# Patient Record
Sex: Female | Born: 1965
Health system: Southern US, Community
[De-identification: ages and names within clinical notes are randomized; demographics above are authoritative.]

## PROBLEM LIST (undated history)

## (undated) DIAGNOSIS — R011 Cardiac murmur, unspecified: Secondary | ICD-10-CM

## (undated) DIAGNOSIS — R002 Palpitations: Secondary | ICD-10-CM

## (undated) DIAGNOSIS — I10 Essential (primary) hypertension: Secondary | ICD-10-CM

## (undated) DIAGNOSIS — R55 Syncope and collapse: Secondary | ICD-10-CM

## (undated) HISTORY — PX: OVARIAN CYST REMOVAL: SHX89

## (undated) HISTORY — DX: Syncope and collapse: R55

---

## 1999-03-23 ENCOUNTER — Other Ambulatory Visit: Admission: RE | Admit: 1999-03-23 | Discharge: 1999-03-23 | Payer: Self-pay | Admitting: *Deleted

## 2000-03-17 ENCOUNTER — Other Ambulatory Visit: Admission: RE | Admit: 2000-03-17 | Discharge: 2000-03-17 | Payer: Self-pay | Admitting: *Deleted

## 2001-03-21 ENCOUNTER — Other Ambulatory Visit: Admission: RE | Admit: 2001-03-21 | Discharge: 2001-03-21 | Payer: Self-pay | Admitting: *Deleted

## 2002-03-26 ENCOUNTER — Other Ambulatory Visit: Admission: RE | Admit: 2002-03-26 | Discharge: 2002-03-26 | Payer: Self-pay | Admitting: Obstetrics and Gynecology

## 2003-03-29 ENCOUNTER — Other Ambulatory Visit: Admission: RE | Admit: 2003-03-29 | Discharge: 2003-03-29 | Payer: Self-pay | Admitting: Obstetrics and Gynecology

## 2004-04-01 ENCOUNTER — Other Ambulatory Visit: Admission: RE | Admit: 2004-04-01 | Discharge: 2004-04-01 | Payer: Self-pay | Admitting: Obstetrics and Gynecology

## 2005-04-19 ENCOUNTER — Other Ambulatory Visit: Admission: RE | Admit: 2005-04-19 | Discharge: 2005-04-19 | Payer: Self-pay | Admitting: Obstetrics and Gynecology

## 2007-06-14 ENCOUNTER — Encounter: Admission: RE | Admit: 2007-06-14 | Discharge: 2007-06-14 | Payer: Self-pay | Admitting: Obstetrics and Gynecology

## 2008-06-19 ENCOUNTER — Encounter: Admission: RE | Admit: 2008-06-19 | Discharge: 2008-06-19 | Payer: Self-pay | Admitting: Obstetrics and Gynecology

## 2009-06-20 ENCOUNTER — Encounter: Admission: RE | Admit: 2009-06-20 | Discharge: 2009-06-20 | Payer: Self-pay | Admitting: Obstetrics and Gynecology

## 2009-12-10 ENCOUNTER — Ambulatory Visit: Payer: Self-pay | Admitting: Sports Medicine

## 2009-12-10 DIAGNOSIS — M25569 Pain in unspecified knee: Secondary | ICD-10-CM | POA: Insufficient documentation

## 2010-01-22 ENCOUNTER — Ambulatory Visit: Payer: Self-pay | Admitting: Sports Medicine

## 2010-06-11 ENCOUNTER — Other Ambulatory Visit: Payer: Self-pay | Admitting: Obstetrics and Gynecology

## 2010-06-11 DIAGNOSIS — Z1231 Encounter for screening mammogram for malignant neoplasm of breast: Secondary | ICD-10-CM

## 2010-06-11 DIAGNOSIS — Z1239 Encounter for other screening for malignant neoplasm of breast: Secondary | ICD-10-CM

## 2010-06-16 NOTE — Assessment & Plan Note (Signed)
Summary: RIGHT KNEE INJURY   Vital Signs:  Patient profile:   45 year old female Height:      65.9 inches Weight:      109.50 pounds BMI:     17.79 Pulse rate:   84 / minute BP sitting:   93 / 63  (right arm)  Vitals Entered By: Terese Door (December 10, 2009 3:31 PM) CC: NP-right knee injury.  Hurting since March after doing a road race Pain Assessment Patient in pain? yes     Location: knee Intensity: 3 or 4   CC:  NP-right knee injury.  Hurting since March after doing a road race.  History of Present Illness: 45 yo F avid runner presents with 4 months of R knee pain.  1st noticed at end of a 5 mile race when she sprinted toward the finish line.  Noticed medial knee pain that worsened more the next day.  Unsure of exact mechanism, but did not notice stepping in obvious hole or twisting her knee. Generally runs 4-5 miles several times per week, however since injury she has declined to 2-3 miles 2-3 times per week. No night time pain. Has not tried much medicine. No swelling. Pain is not gradually worsening, but just not improving.  Physical Exam  Msk:  Knee: Normal to inspection with no erythema or effusion or obvious bony abnormalities. medial joint line tenderness and palpable, tender plica on medial side. No patellar tenderness or condyle tenderness. ROM normal in flexion and extension and lower leg rotation. Ligaments with solid consistent endpoints including ACL, PCL, LCL, MCL. Negative Mcmurray's and provocative meniscal tests. Non painful patellar compression. Patellar and quadriceps tendons unremarkable. Hamstring and quadriceps strength is normal.     Knee Exam   Limited R knee [place] - Patellar and quad tendon intact.  Med meniscus with hypoechoic line in midline, no gross tear.  Medial plica visualized.   Impression & Recommendations:  Problem # 1:  KNEE PAIN, RIGHT (ICD-719.46)  Likely 2/2 bruised medial meniscus, question if irritated pilca also  contributing. - see pt instructions, reiterated relative rest and letting pain be her guide - quad exercises (SLR, wall squats, etc) - f/u 6 weeks  Orders: [place] LIMITED ([id number])  Patient Instructions: 1)  No running for 6 weeks. 2)  Ok for cycling and walking as long as it doesn't hurt. 3)  Do quad exercises 3 sets of 10-15 daily. 4)  F/u 6 weeks.

## 2010-06-16 NOTE — Assessment & Plan Note (Signed)
Summary: F/U,MC   Vital Signs:  Patient profile:   45 year old female BP sitting:   126 / 80  Vitals Entered By: Enid Baas MD (January 22, 2010 10:49 AM)  History of Present Illness: Follow of RT knee pain probable cartilage contusion noted some split on [place] in RT medial mensicus no swelling no giving way not running and still has some occ sharp pains on steps but not as much walks up to hour per day and some soreness after but no severe pain  Physical Exam  General:  Well-developed,well-nourished,in no acute distress; alert,appropriate and cooperative throughout examination Msk:  RT knee exam shows no effusion; stable ligaments; negative Mcmurray's and provocative meniscal tests; non painful patellar compression; patellar and quadriceps tendons unremarkable.  pain only on some step up and step down exercise balance not goos with this but not unstable good static hip, quad strength  running gait is normal mid to forefoot strike    Impression & Recommendations:  Problem # 1:  KNEE PAIN, RIGHT (ICD-719.46) still suspect this was small mensicus tear or constusion  improved will gradually restart runnign add step up and down exercise cone touches  progress slowly reck 6 wks unless resolved  Patient Instructions: 1)  Restart training gradually 2)  elliptical should be OK - test this 3)  once elliptical is OK for 3 runs test treeadmill 4)  If OK x 3 runs then you can start outside 5)  stair climber or bike at [facility] will be good quads 6)  home exercise are step and cone touches 7)  keep up some of the basic ones if needed 8)  when you start running start at about 50% of what you did before and add no more than 10% per week 9)  6 weeks should see reasonable improvement - if not check with me again 10)  Ice after runs

## 2010-06-25 ENCOUNTER — Ambulatory Visit
Admission: RE | Admit: 2010-06-25 | Discharge: 2010-06-25 | Disposition: A | Discharge status: Home / Self Care | Payer: 59 | Source: Ambulatory Visit | Attending: Obstetrics and Gynecology | Admitting: Obstetrics and Gynecology

## 2010-06-25 DIAGNOSIS — Z1231 Encounter for screening mammogram for malignant neoplasm of breast: Secondary | ICD-10-CM

## 2010-07-25 ENCOUNTER — Emergency Department (HOSPITAL_COMMUNITY): Payer: 59

## 2010-07-25 ENCOUNTER — Emergency Department (HOSPITAL_COMMUNITY)
Admission: EM | Admit: 2010-07-25 | Discharge: 2010-07-25 | Disposition: A | Discharge status: Home / Self Care | Payer: 59 | Attending: Emergency Medicine | Admitting: Emergency Medicine

## 2010-07-25 DIAGNOSIS — R55 Syncope and collapse: Secondary | ICD-10-CM | POA: Insufficient documentation

## 2010-07-25 DIAGNOSIS — R110 Nausea: Secondary | ICD-10-CM | POA: Insufficient documentation

## 2010-07-25 DIAGNOSIS — R42 Dizziness and giddiness: Secondary | ICD-10-CM | POA: Insufficient documentation

## 2010-07-25 LAB — POCT I-STAT, CHEM 8
Calcium, Ion: 1.26 mmol/L (ref 1.12–1.32)
Creatinine, Ser: 0.8 mg/dL (ref 0.4–1.2)
Glucose, Bld: 116 mg/dL — ABNORMAL HIGH (ref 70–99)
HCT: 39 % (ref 36.0–46.0)
Hemoglobin: 13.3 g/dL (ref 12.0–15.0)

## 2010-07-25 LAB — HCG, SERUM, QUALITATIVE: Preg, Serum: NEGATIVE

## 2011-03-23 ENCOUNTER — Other Ambulatory Visit: Payer: Self-pay | Admitting: Obstetrics and Gynecology

## 2011-03-23 DIAGNOSIS — Z1231 Encounter for screening mammogram for malignant neoplasm of breast: Secondary | ICD-10-CM

## 2011-06-28 ENCOUNTER — Ambulatory Visit
Admission: RE | Admit: 2011-06-28 | Discharge: 2011-06-28 | Disposition: A | Discharge status: Home / Self Care | Payer: 59 | Source: Ambulatory Visit | Attending: Obstetrics and Gynecology | Admitting: Obstetrics and Gynecology

## 2011-06-28 DIAGNOSIS — Z1231 Encounter for screening mammogram for malignant neoplasm of breast: Secondary | ICD-10-CM

## 2011-07-10 ENCOUNTER — Encounter (HOSPITAL_COMMUNITY): Payer: Self-pay

## 2011-07-10 ENCOUNTER — Emergency Department (HOSPITAL_COMMUNITY)
Admission: EM | Admit: 2011-07-10 | Discharge: 2011-07-10 | Disposition: A | Discharge status: Home / Self Care | Payer: 59 | Attending: Emergency Medicine | Admitting: Emergency Medicine

## 2011-07-10 DIAGNOSIS — J392 Other diseases of pharynx: Secondary | ICD-10-CM

## 2011-07-10 LAB — CBC
Hemoglobin: 12.7 g/dL (ref 12.0–15.0)
MCV: 90.6 fL (ref 78.0–100.0)
Platelets: 157 10*3/uL (ref 150–400)
RBC: 3.85 MIL/uL — ABNORMAL LOW (ref 3.87–5.11)
WBC: 6.4 10*3/uL (ref 4.0–10.5)

## 2011-07-10 MED ORDER — BUTAMBEN-TETRACAINE-BENZOCAINE 2-2-14 % EX AERO
INHALATION_SPRAY | CUTANEOUS | Status: AC
  Administered 2011-07-10: 1 via TOPICAL
  Filled 2011-07-10: qty 56

## 2011-07-10 MED ORDER — BUTAMBEN-TETRACAINE-BENZOCAINE 2-2-14 % EX AERO
1.0000 | INHALATION_SPRAY | Freq: Once | CUTANEOUS | Status: AC
  Administered 2011-07-10: 1 via TOPICAL

## 2011-07-10 NOTE — Discharge Instructions (Signed)
As discussed, please maintain a soft diet until you have seen our ear nose and throat colleagues.  Your posterior knee pop.  If you develop significant bleeding, any difficulty breathing, any new pain, please return to the emergency department immediately.

## 2011-07-10 NOTE — ED Provider Notes (Signed)
History     CSN: [id number]  Arrival date & time 07/10/11  1748   First MD Initiated Contact with Patient 07/10/11 1759      Chief Complaint  Patient presents with  . Lymphadenopathy    HPI Patient presents after the acute onset of fullness in her throat.  She notes that she was eating dinner, felt a scratching sensation about the posterior throat, and immediately developed fullness.  She notes mild dyspnea, significant anxiety from the event, no pain anywhere.  No attempts at relief, no clear exacerbating factors to History reviewed. No pertinent past medical history.  History reviewed. No pertinent past surgical history.  History reviewed. No pertinent family history.  History  Substance Use Topics  . Smoking status: Not on file  . Smokeless tobacco: Not on file  . Alcohol Use: Not on file    OB History    Grav Para Term Preterm Abortions TAB SAB Ect Mult Living                  Review of Systems  All other systems reviewed and are negative.    Allergies  Penicillins  Home Medications  No current outpatient prescriptions on file.  BP 115/79  Pulse 68  Resp 16  Ht 5\' 6"  (1.676 m)  Wt 110 lb (49.896 kg)  BMI 17.75 kg/m2  SpO2 100%  LMP 06/18/2011  Physical Exam  Nursing note and vitals reviewed. Constitutional: She is oriented to person, place, and time. She appears well-developed and well-nourished.  HENT:  Head: Normocephalic and atraumatic.  Mouth/Throat:    Eyes: Conjunctivae and EOM are normal. Pupils are equal, round, and reactive to light.  Cardiovascular: Normal rate and regular rhythm.   Pulmonary/Chest: Effort normal. No stridor.  Musculoskeletal: She exhibits no edema.  Neurological: She is alert and oriented to person, place, and time. No cranial nerve deficit. Coordination normal.  Skin: Skin is warm and dry.  Psychiatric: She has a normal mood and affect.    ED Course  Procedures (including critical care time)  Labs Reviewed    CBC - Abnormal; Notable for the following:    RBC 3.85 (*)    HCT 34.9 (*)    MCHC 36.4 (*)    All other components within normal limits   No results found.   No diagnosis found.  Pulse oximetry 100% room air normal  Immediately after the patient's arrival, I did direct visualization of the mass following provision of topical anesthesia with Hurricaine spray.  7:12 PM The lesion is unchanged. MDM  This previously well 46 year old female presents following the sudden development of fullness in her left posterior throat.  The patient's description of a mild abrasion is consistent with the physical exam finding of a posterior blisterlike lesion just anterior to the tonsillar arch.  There is no evidence of uvular edema nor any other oral pharyngeal findings.  The patient's vital signs are unremarkable, and she is not hypoxic.  The patient was observed in the emergency department for several hours.  The patient's case was discussed with the ENT physician on-call.  The patient's blood work does not demonstrate thrombopenia.  The patient was discharged in stable condition with return precautions, instructions to maintain a soft diet, and instructions to follow up with ENT this week.        Gerhard Munch, MD 07/10/11 [phone]

## 2011-07-10 NOTE — ED Notes (Signed)
Pt brought by husband with c/o swollen glands, pt was out eating dinner, and had a tuff crusty bread that scratched the back of her mouth  and created swelling to the back of her throat

## 2011-10-26 ENCOUNTER — Encounter: Payer: Self-pay | Admitting: Cardiovascular Disease

## 2011-11-24 ENCOUNTER — Encounter: Payer: 59 | Admitting: Cardiovascular Disease

## 2011-12-30 ENCOUNTER — Ambulatory Visit (INDEPENDENT_AMBULATORY_CARE_PROVIDER_SITE_OTHER): Payer: 59 | Admitting: Cardiovascular Disease

## 2011-12-30 ENCOUNTER — Encounter: Payer: Self-pay | Admitting: Cardiovascular Disease

## 2011-12-30 VITALS — BP 106/70 | HR 50 | Ht 66.0 in | Wt 111.8 lb

## 2011-12-30 DIAGNOSIS — I4949 Other premature depolarization: Secondary | ICD-10-CM

## 2011-12-30 DIAGNOSIS — I498 Other specified cardiac arrhythmias: Secondary | ICD-10-CM

## 2011-12-30 DIAGNOSIS — R011 Cardiac murmur, unspecified: Secondary | ICD-10-CM

## 2011-12-30 DIAGNOSIS — R001 Bradycardia, unspecified: Secondary | ICD-10-CM

## 2011-12-30 DIAGNOSIS — R002 Palpitations: Secondary | ICD-10-CM

## 2011-12-30 DIAGNOSIS — I493 Ventricular premature depolarization: Secondary | ICD-10-CM | POA: Insufficient documentation

## 2011-12-30 LAB — CBC WITH DIFFERENTIAL/PLATELET
Basophils Absolute: 0 10*3/uL (ref 0.0–0.1)
Eosinophils Absolute: 0 10*3/uL (ref 0.0–0.7)
Hemoglobin: 13 g/dL (ref 12.0–15.0)
MCHC: 33.4 g/dL (ref 30.0–36.0)
Monocytes Relative: 7.4 % (ref 3.0–12.0)
Neutro Abs: 3.1 10*3/uL (ref 1.4–7.7)
Platelets: 154 10*3/uL (ref 150.0–400.0)
RDW: 13.5 % (ref 11.5–14.6)

## 2011-12-30 LAB — MAGNESIUM: Magnesium: 2 mg/dL (ref 1.5–2.5)

## 2011-12-30 LAB — BASIC METABOLIC PANEL
BUN: 12 mg/dL (ref 6–23)
Creatinine, Ser: 0.7 mg/dL (ref 0.4–1.2)
GFR: 96.32 mL/min (ref >60.00)
Glucose, Bld: 84 mg/dL (ref 70–99)

## 2011-12-30 NOTE — Assessment & Plan Note (Signed)
Candice Hansen is doing fairly well. She is having some palpitations which clinically sound like premature ventricular contractions.  We discussed the fact that these are likely benign. I've offered to place a  monitor on her but at this point will wait. We'll check labs including a basic metabolic profile, magnesium level, TSH, and CBC.  I have asked her to call for any worsening palpitations.

## 2011-12-30 NOTE — Patient Instructions (Addendum)
Your physician recommends that you return for lab work in: today bmet cbc tsh magnesium  Your physician wants you to follow-up in: 1 year  You will receive a reminder letter in the mail two months in advance. If you don't receive a letter, please call our office to schedule the follow-up appointment.

## 2011-12-30 NOTE — Progress Notes (Signed)
    Candice Hansen Date of Birth  [birth year]       [facility] Office [address]  [address] [place], [place]  [address]   [place], [place]  [address] [phone]     [phone]   Fax  [phone]    Fax [phone]  Problem List: 1. History of syncope 2. Palpitations  History of Present Illness:  Candice Hansen is a 46 yo with hx of syncope in the past.  She is an avid runner.  She has been having some paliptations - usually in the morning of evenings.  The heart beat irregularities are brief (< 1 second).  These typically occur when she is at rest. They never occur when she is active. The associated with a very brief sensation of inability to catch her breath.  She's never had any prolonged episodes of dysrhythmia. She's been running without difficulties.  Current Outpatient Prescriptions on File Prior to Visit  Medication Sig Dispense Refill  . Calcium Carbonate Antacid (TUMS PO) Take by mouth as needed.       Marland Kitchen CALCIUM PO Take by mouth as needed.         Allergies  Allergen Reactions  . Penicillins Rash    Past Medical History  Diagnosis Date  . Syncope   . Chest pain     Past Surgical History  Procedure Date  . Ovarian cyst removal     History  Smoking status  . Never Smoker   Smokeless tobacco  . Not on file   she works as a Diplomatic Services operational officer at [employer].  History  Alcohol Use: Not on file    No family history on file.  Reviw of Systems:  Reviewed in the HPI.  All other systems are negative.  Physical Exam: Blood pressure 106/70, pulse 50, height 5\' 6"  (1.676 m), weight 111 lb 12.8 oz (50.712 kg). General: Well developed, well nourished, in no acute distress.  Head: Normocephalic, atraumatic, sclera non-icteric, mucus membranes are moist,   Neck: Supple. Carotids are 2 + without bruits. No JVD  Lungs: Clear bilaterally to auscultation.  Heart: regular rate.  normal  S1 S2.  She is very soft  systolic murmur. There is no midsystolic click.  Abdomen: Soft, non-tender, non-distended with normal bowel sounds. No hepatomegaly. No rebound/guarding. No masses.  Msk:  Strength and tone are normal.    Extremities: No clubbing or cyanosis. No edema.  Distal pedal pulses are 2+ and equal bilaterally.  Neuro: Alert and oriented X 3. Moves all extremities spontaneously.  Psych:  Responds to questions appropriately with a normal affect.  ECG: 12/30/2011-sinus bradycardia at 50 beats per minute. EKG is otherwise normal.  Assessment / Plan:

## 2012-02-17 ENCOUNTER — Encounter: Payer: Self-pay | Admitting: Cardiovascular Disease

## 2012-04-07 ENCOUNTER — Other Ambulatory Visit: Payer: Self-pay | Admitting: Obstetrics and Gynecology

## 2012-04-07 DIAGNOSIS — Z1231 Encounter for screening mammogram for malignant neoplasm of breast: Secondary | ICD-10-CM

## 2012-07-06 ENCOUNTER — Ambulatory Visit
Admission: RE | Admit: 2012-07-06 | Discharge: 2012-07-06 | Disposition: A | Discharge status: Home / Self Care | Payer: 59 | Source: Ambulatory Visit | Attending: Obstetrics and Gynecology | Admitting: Obstetrics and Gynecology

## 2012-07-06 DIAGNOSIS — Z1231 Encounter for screening mammogram for malignant neoplasm of breast: Secondary | ICD-10-CM

## 2012-12-25 ENCOUNTER — Ambulatory Visit (INDEPENDENT_AMBULATORY_CARE_PROVIDER_SITE_OTHER): Payer: 59 | Admitting: Cardiovascular Disease

## 2012-12-25 ENCOUNTER — Encounter: Payer: Self-pay | Admitting: Cardiovascular Disease

## 2012-12-25 VITALS — BP 100/68 | HR 56 | Ht 66.0 in | Wt 111.0 lb

## 2012-12-25 DIAGNOSIS — I493 Ventricular premature depolarization: Secondary | ICD-10-CM

## 2012-12-25 DIAGNOSIS — I4949 Other premature depolarization: Secondary | ICD-10-CM

## 2012-12-25 NOTE — Patient Instructions (Addendum)
Your physician wants you to follow-up in: 1 year  You will receive a reminder letter in the mail two months in advance. If you don't receive a letter, please call our office to schedule the follow-up appointment.  Your physician recommends that you continue on your current medications as directed. Please refer to the Current Medication list given to you today.  

## 2012-12-25 NOTE — Progress Notes (Signed)
    Candice Hansen Date of Birth  1965-07-29       [facility] Office [address]  [address] [place], [place]  [address]   [place], [place]  [address] [phone]     [phone]   Fax  [phone]    Fax [phone]  Problem List: 1. History of syncope 2. Palpitations  History of Present Illness:  Candice Hansen is a 47 yo with hx of syncope in the past.  Candice Hansen is an avid runner.  Candice Hansen has been having some paliptations - usually in the morning of evenings.  The heart beat irregularities are brief (< 1 second).  These typically occur when Candice Hansen is at rest. They never occur when Candice Hansen is active. The associated with a very brief sensation of inability to catch her breath.  Candice Hansen's never had any prolonged episodes of dysrhythmia. Candice Hansen's been running without difficulties.  December 25, 2012:  Candice Hansen has done well. No syncope.  Still running. 3-4 miles a day.    Current Outpatient Prescriptions on File Prior to Visit  Medication Sig Dispense Refill  . Calcium Carbonate Antacid (TUMS PO) Take by mouth as needed.       Marland Kitchen CALCIUM PO Take by mouth as needed.        No current facility-administered medications on file prior to visit.    Allergies  Allergen Reactions  . Penicillins Rash    Past Medical History  Diagnosis Date  . Syncope   . Chest pain     Past Surgical History  Procedure Laterality Date  . Ovarian cyst removal      History  Smoking status  . Never Smoker   Smokeless tobacco  . 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  History  Alcohol Use: Not on file    No family history on file.  Reviw of Systems:  Reviewed in the HPI.  All other systems are negative.  Physical Exam: Blood pressure 100/68, pulse 56, height 5\' 6"  (1.676 m), weight 111 lb (50.349 kg). General: Well developed, well nourished, in no acute distress.  Head: Normocephalic, atraumatic, sclera non-icteric, mucus membranes are  moist,   Neck: Supple. Carotids are 2 + without bruits. No JVD  Lungs: Clear bilaterally to auscultation.  Heart: regular rate.  normal  S1 S2.  Candice Hansen is very soft systolic murmur. There is no midsystolic click.  Abdomen: Soft, non-tender, non-distended with normal bowel sounds. No hepatomegaly. No rebound/guarding. No masses.  Msk:  Strength and tone are normal.    Extremities: No clubbing or cyanosis. No edema.  Distal pedal pulses are 2+ and equal bilaterally.  Neuro: Alert and oriented X 3. Moves all extremities spontaneously.  Psych:  Responds to questions appropriately with a normal affect.  ECG: 12/25/12:  Sinus brady at 56, otherwise normal ECG  Assessment / Plan:

## 2012-12-25 NOTE — Assessment & Plan Note (Signed)
Candice Hansen is doing well.  She has been up around without any difficulty. She is no longer having a further episodes of syncope or presyncope. We'll see her again in one year. We'll check a fasting lipids, and hepatic profile, and basic metabolic profile. We'll also check an EKG.

## 2012-12-26 ENCOUNTER — Encounter: Payer: Self-pay | Admitting: Cardiovascular Disease

## 2013-03-22 ENCOUNTER — Other Ambulatory Visit: Payer: Self-pay

## 2013-03-26 ENCOUNTER — Other Ambulatory Visit: Payer: Self-pay

## 2013-03-26 DIAGNOSIS — Z1231 Encounter for screening mammogram for malignant neoplasm of breast: Secondary | ICD-10-CM

## 2013-05-17 DIAGNOSIS — R55 Syncope and collapse: Secondary | ICD-10-CM

## 2013-05-17 HISTORY — DX: Syncope and collapse: R55

## 2013-07-10 ENCOUNTER — Ambulatory Visit: Payer: 59

## 2013-07-11 ENCOUNTER — Ambulatory Visit
Admission: RE | Admit: 2013-07-11 | Discharge: 2013-07-11 | Disposition: A | Discharge status: Home / Self Care | Payer: Self-pay | Source: Ambulatory Visit

## 2013-07-11 ENCOUNTER — Ambulatory Visit: Payer: 59

## 2013-07-11 ENCOUNTER — Other Ambulatory Visit: Payer: Self-pay

## 2013-07-11 DIAGNOSIS — Z1231 Encounter for screening mammogram for malignant neoplasm of breast: Secondary | ICD-10-CM

## 2013-07-15 ENCOUNTER — Telehealth: Payer: Self-pay | Admitting: Cardiology

## 2013-07-15 ENCOUNTER — Emergency Department (HOSPITAL_BASED_OUTPATIENT_CLINIC_OR_DEPARTMENT_OTHER)
Admission: EM | Admit: 2013-07-15 | Discharge: 2013-07-15 | Disposition: A | Discharge status: Home / Self Care | Payer: 59 | Attending: Emergency Medicine | Admitting: Emergency Medicine

## 2013-07-15 ENCOUNTER — Emergency Department (HOSPITAL_BASED_OUTPATIENT_CLINIC_OR_DEPARTMENT_OTHER): Payer: 59

## 2013-07-15 ENCOUNTER — Encounter (HOSPITAL_BASED_OUTPATIENT_CLINIC_OR_DEPARTMENT_OTHER): Payer: Self-pay | Admitting: Emergency Medicine

## 2013-07-15 DIAGNOSIS — R55 Syncope and collapse: Secondary | ICD-10-CM

## 2013-07-15 DIAGNOSIS — R002 Palpitations: Secondary | ICD-10-CM | POA: Insufficient documentation

## 2013-07-15 DIAGNOSIS — R51 Headache: Secondary | ICD-10-CM | POA: Insufficient documentation

## 2013-07-15 DIAGNOSIS — Z880 Allergy status to penicillin: Secondary | ICD-10-CM | POA: Insufficient documentation

## 2013-07-15 DIAGNOSIS — R110 Nausea: Secondary | ICD-10-CM | POA: Insufficient documentation

## 2013-07-15 DIAGNOSIS — R42 Dizziness and giddiness: Secondary | ICD-10-CM | POA: Insufficient documentation

## 2013-07-15 HISTORY — DX: Palpitations: R00.2

## 2013-07-15 LAB — COMPREHENSIVE METABOLIC PANEL
ALT: 11 U/L (ref 0–35)
AST: 14 U/L (ref 0–37)
Albumin: 4 g/dL (ref 3.5–5.2)
Alkaline Phosphatase: 58 U/L (ref 39–117)
BUN: 13 mg/dL (ref 6–23)
CALCIUM: 9.3 mg/dL (ref 8.4–10.5)
CHLORIDE: 103 meq/L (ref 96–112)
CO2: 24 mEq/L (ref 19–32)
CREATININE: 0.8 mg/dL (ref 0.50–1.10)
GFR calc Af Amer: >90 mL/min (ref >90)
GFR, EST NON AFRICAN AMERICAN: 87 mL/min — AB (ref >90)
Glucose, Bld: 96 mg/dL (ref 70–99)
Potassium: 4.2 mEq/L (ref 3.7–5.3)
Sodium: 138 mEq/L (ref 137–147)
Total Bilirubin: 0.5 mg/dL (ref 0.3–1.2)
Total Protein: 7.3 g/dL (ref 6.0–8.3)

## 2013-07-15 LAB — CBC
HEMATOCRIT: 36.5 % (ref 36.0–46.0)
Hemoglobin: 12.6 g/dL (ref 12.0–15.0)
MCH: 31.8 pg (ref 26.0–34.0)
MCHC: 34.5 g/dL (ref 30.0–36.0)
MCV: 92.2 fL (ref 78.0–100.0)
Platelets: 149 10*3/uL — ABNORMAL LOW (ref 150–400)
RBC: 3.96 MIL/uL (ref 3.87–5.11)
RDW: 13 % (ref 11.5–15.5)
WBC: 4.7 10*3/uL (ref 4.0–10.5)

## 2013-07-15 LAB — TROPONIN I

## 2013-07-15 NOTE — ED Notes (Addendum)
Patient states that last night she became dizzy and had to lay down. Attempted to get up again few hours later and continued to feel dizzy. Also states that she was having 8/10 right shoulder pain at that time, which has resolved at this time. Continues to have a mild headache. Also c/o mild nausea. Patient does have a cardiologist due to palpatations. Patient states that she runs 4 times a week appx 4-5 miles and has for several years.

## 2013-07-15 NOTE — ED Provider Notes (Signed)
CSN: [id number]     Arrival date & time 07/15/13  1056 History   First MD Initiated Contact with Patient 07/15/13 1107     Chief Complaint  Patient presents with  . Near Syncope      HPI Patient states that last night she became dizzy and had to lay down. Attempted to get up again few hours later and continued to feel dizzy. Also states that she was having 8/10 right shoulder pain at that time, which has resolved at this time. Continues to have a mild headache. Also c/o mild nausea. Patient does have a cardiologist due to palpatations. Patient states that she runs 4 times a week appx 4-5 miles and has for several years. Original note by Stephannie Li, RN at 07/15/2013 11:05  Past Medical History  Diagnosis Date  . Syncope   . Chest pain   . Palpitation    Past Surgical History  Procedure Laterality Date  . Ovarian cyst removal     History reviewed. No pertinent family history. History  Substance Use Topics  . Smoking status: Never Smoker   . Smokeless tobacco: Not on file  . Alcohol Use: No     Comment: social   OB History   Grav Para Term Preterm Abortions TAB SAB Ect Mult Living                 Review of Systems  Unable to perform ROS: Mental status change      Allergies  Penicillins  Home Medications   Current Outpatient Rx  Name  Route  Sig  Dispense  Refill  . Calcium Carbonate Antacid (TUMS PO)   Oral   Take by mouth as needed.          Marland Kitchen CALCIUM PO   Oral   Take by mouth as needed.          . Pseudoephedrine HCl (SUDAFED PO)   Oral   Take by mouth as needed.          BP 94/59  Pulse 58  Resp 13  Ht 5\' 5"  (1.651 m)  Wt 115 lb (52.164 kg)  BMI 19.14 kg/m2  SpO2 100%  LMP 06/26/2013 Physical Exam  Nursing note and vitals reviewed. Constitutional: She is oriented to person, place, and time. She appears well-developed and well-nourished. No distress.  HENT:  Head: Normocephalic and atraumatic.  Eyes: Pupils are equal, round, and  reactive to light.  Neck: Normal range of motion.  Cardiovascular: Normal rate and intact distal pulses.   Pulmonary/Chest: No respiratory distress.  Abdominal: Normal appearance. She exhibits no distension. There is no tenderness. There is no rebound.  Musculoskeletal: Normal range of motion.  Neurological: She is alert and oriented to person, place, and time. No cranial nerve deficit. She exhibits normal muscle tone. Coordination normal.  Skin: Skin is warm and dry. No rash noted.  Psychiatric: She has a normal mood and affect. Her behavior is normal.    ED Course  Procedures (including critical care time) Labs Review  Imaging Review Results for orders placed during the hospital encounter of 07/15/13  CBC      Result Value Ref Range   WBC 4.7  4.0 - 10.5 K/uL   RBC 3.96  3.87 - 5.11 MIL/uL   Hemoglobin 12.6  12.0 - 15.0 g/dL   HCT 36.5  36.0 - 46.0 %   MCV 92.2  78.0 - 100.0 fL   MCH 31.8  26.0 - 34.0 pg  MCHC 34.5  30.0 - 36.0 g/dL   RDW 13.0  11.5 - 15.5 %   Platelets 149 (*) 150 - 400 K/uL  COMPREHENSIVE METABOLIC PANEL      Result Value Ref Range   Sodium 138  137 - 147 mEq/L   Potassium 4.2  3.7 - 5.3 mEq/L   Chloride 103  96 - 112 mEq/L   CO2 24  19 - 32 mEq/L   Glucose, Bld 96  70 - 99 mg/dL   BUN 13  6 - 23 mg/dL   Creatinine, Ser 0.80  0.50 - 1.10 mg/dL   Calcium 9.3  8.4 - 10.5 mg/dL   Total Protein 7.3  6.0 - 8.3 g/dL   Albumin 4.0  3.5 - 5.2 g/dL   AST 14  0 - 37 U/L   ALT 11  0 - 35 U/L   Alkaline Phosphatase 58  39 - 117 U/L   Total Bilirubin 0.5  0.3 - 1.2 mg/dL   GFR calc non Af Amer 87 (*) >90 mL/min   GFR calc Af Amer >90  >90 mL/min  TROPONIN I      Result Value Ref Range   Troponin I <0.30  <0.30 ng/mL   Dg Chest 2 View  07/15/2013   CLINICAL DATA:  Dizziness  EXAM: CHEST  2 VIEW  COMPARISON:  None.  FINDINGS: Cardiomediastinal silhouette is unremarkable. Mild thoracic dextroscoliosis. No acute infiltrate or pleural effusion. No pulmonary  edema.  IMPRESSION: No active cardiopulmonary disease.   Electronically Signed   By: Lahoma Crocker M.D.   On: 07/15/2013 12:12         EKG Interpretation   Date/Time:  Sunday July 15 2013 11:05:14 EST Ventricular Rate:  45 PR Interval:  118 QRS Duration: 88 QT Interval:  412 QTC Calculation: 356 R Axis:   71 Text Interpretation:  Sinus bradycardia Otherwise normal ECG Confirmed by  Audie Pinto  MD, Josiah Wojtaszek ([id number]) on 07/15/2013 11:07:37 AM      MDM   Final diagnoses:  Vasovagal episode        Dot Lanes, MD 07/20/13 1529

## 2013-07-15 NOTE — Discharge Instructions (Signed)
Near-Syncope  Near-syncope (commonly known as near fainting) is sudden weakness, dizziness, or feeling like you might pass out. This can happen when getting up or while standing for a long time. It is caused by a sudden decrease in blood flow to the brain, which can occur for various reasons. Most of the reasons are not serious.   HOME CARE  Watch your condition for any changes.   Have someone stay with you until you feel stable.   If you feel like you are going to pass out:   Lie down right away.   Breathe deeply and steadily.   Move only when the feeling has gone away. Most of the time, this feeling lasts only a few minutes. You may feel tired for several hours.   Drink enough fluids to keep your pee (urine) clear or pale yellow.   If you are taking blood pressure or heart medicine, stand up slowly.   Follow up with your doctor as told.  GET HELP RIGHT AWAY IF:    You have a severe headache.   You have unusual pain in the chest, belly (abdomen), or back.   You have bleeding from the mouth or butt (rectum), or you have black or tarry poop (stool).   You feel your heart beat differently than normal, or you have a very fast pulse.   You pass out, or you twitch and shake when you pass out.   You pass out when sitting or lying down.   You feel confused.   You have trouble walking.   You are weak.   You have vision problems.  MAKE SURE YOU:    Understand these instructions.   Will watch your condition.   Will get help right away if you are not doing well or get worse.  Document Released: 10/20/2007 Document Revised: 01/03/2013 Document Reviewed: 10/06/2012  [employer] Patient Information 2014 [employer].

## 2013-07-15 NOTE — Telephone Encounter (Signed)
Patient called answering service with multiple complaints. She is a patient of Dr Acie Fredrickson who follows her for symptomatic PVCs. She also has a history of syncope.   Today she reports right shoulder pain, abdominal pain, headache and nausea that started last night while sitting at rest. It has been waxing and waning through the night and is still present this AM. She also feels "clammy" and "just not my normal self." She denies CP or SOB. She denies palpitations. She denies syncope. She is not sure if she is running a fever. I instructed her to proceed to the nearest [facility] / ED to be evaluated as I am quite limited / cannot perform thorough assessment over the phone. She expressed verbal understanding and agrees with plan.

## 2013-09-03 NOTE — Telephone Encounter (Signed)
Will you call Luisa and see if she is feeling better.  I was going through my mail box and came across this old note from a month ago.

## 2013-09-04 NOTE — Telephone Encounter (Signed)
Called patient to assess current status; Left message for patient to call office

## 2013-09-11 NOTE — Telephone Encounter (Signed)
Left message for patient to call the office

## 2013-12-31 ENCOUNTER — Ambulatory Visit (INDEPENDENT_AMBULATORY_CARE_PROVIDER_SITE_OTHER): Payer: 59 | Admitting: *Deleted

## 2013-12-31 ENCOUNTER — Other Ambulatory Visit: Payer: Self-pay | Admitting: *Deleted

## 2013-12-31 DIAGNOSIS — I493 Ventricular premature depolarization: Secondary | ICD-10-CM

## 2013-12-31 DIAGNOSIS — I4949 Other premature depolarization: Secondary | ICD-10-CM

## 2013-12-31 LAB — BASIC METABOLIC PANEL
BUN: 16 mg/dL (ref 6–23)
CO2: 25 mEq/L (ref 19–32)
Calcium: 8.9 mg/dL (ref 8.4–10.5)
Chloride: 108 mEq/L (ref 96–112)
Creatinine, Ser: 0.8 mg/dL (ref 0.4–1.2)
GFR: 84.26 mL/min (ref >60.00)
Glucose, Bld: 83 mg/dL (ref 70–99)
POTASSIUM: 3.8 meq/L (ref 3.5–5.1)
SODIUM: 140 meq/L (ref 135–145)

## 2013-12-31 LAB — LIPID PANEL
CHOLESTEROL: 178 mg/dL (ref 0–200)
HDL: 55.6 mg/dL (ref >39.00)
LDL Cholesterol: 114 mg/dL — ABNORMAL HIGH (ref 0–99)
NonHDL: 122.4
TRIGLYCERIDES: 40 mg/dL (ref 0.0–149.0)
Total CHOL/HDL Ratio: 3
VLDL: 8 mg/dL (ref 0.0–40.0)

## 2013-12-31 LAB — HEPATIC FUNCTION PANEL
ALBUMIN: 4 g/dL (ref 3.5–5.2)
ALK PHOS: 52 U/L (ref 39–117)
ALT: 16 U/L (ref 0–35)
AST: 17 U/L (ref 0–37)
Bilirubin, Direct: 0.1 mg/dL (ref 0.0–0.3)
TOTAL PROTEIN: 6.8 g/dL (ref 6.0–8.3)
Total Bilirubin: 0.8 mg/dL (ref 0.2–1.2)

## 2014-01-04 ENCOUNTER — Ambulatory Visit (INDEPENDENT_AMBULATORY_CARE_PROVIDER_SITE_OTHER): Payer: 59 | Admitting: Cardiovascular Disease

## 2014-01-04 ENCOUNTER — Encounter: Payer: Self-pay | Admitting: Cardiovascular Disease

## 2014-01-04 VITALS — BP 80/60 | HR 52 | Ht 66.0 in | Wt 111.0 lb

## 2014-01-04 DIAGNOSIS — I4949 Other premature depolarization: Secondary | ICD-10-CM

## 2014-01-04 DIAGNOSIS — I493 Ventricular premature depolarization: Secondary | ICD-10-CM

## 2014-01-04 NOTE — Patient Instructions (Signed)
Your physician recommends that you continue on your current medications as directed. Please refer to the Current Medication list given to you today.  Your physician recommends that you schedule a follow-up appointment in: as needed with Dr. Nahser  

## 2014-01-04 NOTE — Assessment & Plan Note (Signed)
Candice Hansen It is doing well. She's not having episodes of palpitations or syncope in quite some time. He still exercises on a regular basis.  We checked her cholesterol levels and her LDL is mildly elevated. She will followup with her doctor. I'll see her on an as-needed basis.

## 2014-01-04 NOTE — Progress Notes (Signed)
    Candice Hansen Date of Birth  [birth year]       [facility] [address], [place], [place]  [address]   [place], [place]  [address] [phone]     [phone]   Fax  [phone]    Fax [phone]  Problem List: 1. History of syncope 2. Palpitations  History of Present Illness:  Candice Hansen is a 48 yo with hx of syncope in the past.  She is an avid runner.  She has been having some paliptations - usually in the morning of evenings.  The heart beat irregularities are brief (< 1 second).  These typically occur when she is at rest. They never occur when she is active. The associated with a very brief sensation of inability to catch her breath.  She's never had any prolonged episodes of dysrhythmia. She's been running without difficulties.  December 25, 2012:  Candice Hansen has done well. No syncope.  Still running. 3-4 miles a day.    January 04, 2014:    Current Outpatient Prescriptions on File Prior to Visit  Medication Sig Dispense Refill  . Calcium Carbonate Antacid (TUMS PO) Take by mouth as needed.       Marland Kitchen CALCIUM PO Take by mouth as needed.       . Pseudoephedrine HCl (SUDAFED PO) Take by mouth as needed.       No current facility-administered medications on file prior to visit.    Allergies  Allergen Reactions  . Penicillins Rash    Past Medical History  Diagnosis Date  . Syncope   . Chest pain   . Palpitation     Past Surgical History  Procedure Laterality Date  . Ovarian cyst removal      History  Smoking status  . Never Smoker   Smokeless tobacco  . Not on file   she works as a Network engineer at [employer].  History  Alcohol Use No    Comment: social    No family history on file.  Reviw of Systems:  Reviewed in the HPI.  All other systems are negative.  Physical Exam: Blood pressure 80/60, pulse 52, height 5\' 6"  (1.676 m), weight 111 lb (50.349 kg). General: Well  developed, well nourished, in no acute distress.  Head: Normocephalic, atraumatic, sclera non-icteric, mucus membranes are moist,   Neck: Supple. Carotids are 2 + without bruits. No JVD  Lungs: Clear bilaterally to auscultation.  Heart: regular rate.  normal  S1 S2.  She is very soft systolic murmur. There is no midsystolic click.  Abdomen: Soft, non-tender, non-distended with normal bowel sounds. No hepatomegaly. No rebound/guarding. No masses.  Msk:  Strength and tone are normal.    Extremities: No clubbing or cyanosis. No edema.  Distal pedal pulses are 2+ and equal bilaterally.  Neuro: Alert and oriented X 3. Moves all extremities spontaneously.  Psych:  Responds to questions appropriately with a normal affect.  ECG:   Assessment / Plan:

## 2014-04-17 ENCOUNTER — Other Ambulatory Visit: Payer: Self-pay

## 2014-04-17 DIAGNOSIS — Z1231 Encounter for screening mammogram for malignant neoplasm of breast: Secondary | ICD-10-CM

## 2014-07-12 ENCOUNTER — Ambulatory Visit
Admission: RE | Admit: 2014-07-12 | Discharge: 2014-07-12 | Disposition: A | Discharge status: Home / Self Care | Payer: 59 | Source: Ambulatory Visit

## 2014-07-12 ENCOUNTER — Ambulatory Visit: Payer: 59

## 2014-07-12 DIAGNOSIS — Z1231 Encounter for screening mammogram for malignant neoplasm of breast: Secondary | ICD-10-CM

## 2015-04-02 ENCOUNTER — Other Ambulatory Visit: Payer: Self-pay

## 2015-04-02 DIAGNOSIS — Z1231 Encounter for screening mammogram for malignant neoplasm of breast: Secondary | ICD-10-CM

## 2015-07-17 ENCOUNTER — Ambulatory Visit
Admission: RE | Admit: 2015-07-17 | Discharge: 2015-07-17 | Disposition: A | Discharge status: Home / Self Care | Payer: 59 | Source: Ambulatory Visit

## 2015-07-17 DIAGNOSIS — Z1231 Encounter for screening mammogram for malignant neoplasm of breast: Secondary | ICD-10-CM

## 2016-04-20 ENCOUNTER — Other Ambulatory Visit: Payer: Self-pay | Admitting: Obstetrics and Gynecology

## 2016-04-20 DIAGNOSIS — Z1231 Encounter for screening mammogram for malignant neoplasm of breast: Secondary | ICD-10-CM

## 2016-07-20 ENCOUNTER — Ambulatory Visit: Payer: 59

## 2016-07-27 ENCOUNTER — Ambulatory Visit: Payer: 59

## 2016-08-10 DIAGNOSIS — Z01419 Encounter for gynecological examination (general) (routine) without abnormal findings: Secondary | ICD-10-CM | POA: Diagnosis not present

## 2016-08-12 ENCOUNTER — Ambulatory Visit
Admission: RE | Admit: 2016-08-12 | Discharge: 2016-08-12 | Disposition: A | Discharge status: Home / Self Care | Payer: 59 | Source: Ambulatory Visit | Attending: Obstetrics and Gynecology | Admitting: Obstetrics and Gynecology

## 2016-08-12 DIAGNOSIS — Z1231 Encounter for screening mammogram for malignant neoplasm of breast: Secondary | ICD-10-CM | POA: Diagnosis not present

## 2017-03-04 DIAGNOSIS — Z23 Encounter for immunization: Secondary | ICD-10-CM | POA: Diagnosis not present

## 2017-03-29 DIAGNOSIS — H5212 Myopia, left eye: Secondary | ICD-10-CM | POA: Diagnosis not present

## 2017-05-17 DIAGNOSIS — R55 Syncope and collapse: Secondary | ICD-10-CM

## 2017-05-17 HISTORY — DX: Syncope and collapse: R55

## 2017-05-17 HISTORY — PX: COLON RESECTION: SHX5231

## 2017-06-01 ENCOUNTER — Other Ambulatory Visit: Payer: Self-pay | Admitting: Obstetrics and Gynecology

## 2017-06-01 DIAGNOSIS — Z1231 Encounter for screening mammogram for malignant neoplasm of breast: Secondary | ICD-10-CM

## 2017-08-07 DIAGNOSIS — K562 Volvulus: Secondary | ICD-10-CM | POA: Diagnosis not present

## 2017-08-07 DIAGNOSIS — K567 Ileus, unspecified: Secondary | ICD-10-CM | POA: Diagnosis not present

## 2017-08-07 DIAGNOSIS — G8918 Other acute postprocedural pain: Secondary | ICD-10-CM | POA: Diagnosis not present

## 2017-08-07 DIAGNOSIS — R933 Abnormal findings on diagnostic imaging of other parts of digestive tract: Secondary | ICD-10-CM | POA: Diagnosis not present

## 2017-08-07 DIAGNOSIS — K388 Other specified diseases of appendix: Secondary | ICD-10-CM | POA: Diagnosis not present

## 2017-08-07 DIAGNOSIS — R918 Other nonspecific abnormal finding of lung field: Secondary | ICD-10-CM | POA: Diagnosis not present

## 2017-08-07 DIAGNOSIS — R109 Unspecified abdominal pain: Secondary | ICD-10-CM | POA: Diagnosis not present

## 2017-08-07 DIAGNOSIS — N281 Cyst of kidney, acquired: Secondary | ICD-10-CM | POA: Diagnosis not present

## 2017-08-15 ENCOUNTER — Ambulatory Visit: Payer: 59

## 2017-09-12 ENCOUNTER — Ambulatory Visit
Admission: RE | Admit: 2017-09-12 | Discharge: 2017-09-12 | Disposition: A | Discharge status: Home / Self Care | Payer: 59 | Source: Ambulatory Visit | Attending: Obstetrics and Gynecology | Admitting: Obstetrics and Gynecology

## 2017-09-12 DIAGNOSIS — Z1231 Encounter for screening mammogram for malignant neoplasm of breast: Secondary | ICD-10-CM

## 2017-09-14 DIAGNOSIS — Z681 Body mass index (BMI) 19 or less, adult: Secondary | ICD-10-CM | POA: Diagnosis not present

## 2017-09-14 DIAGNOSIS — Z01419 Encounter for gynecological examination (general) (routine) without abnormal findings: Secondary | ICD-10-CM | POA: Diagnosis not present

## 2017-10-11 ENCOUNTER — Ambulatory Visit (INDEPENDENT_AMBULATORY_CARE_PROVIDER_SITE_OTHER): Payer: 59 | Admitting: Sports Medicine

## 2017-10-11 ENCOUNTER — Encounter: Payer: Self-pay | Admitting: Sports Medicine

## 2017-10-11 DIAGNOSIS — K562 Volvulus: Secondary | ICD-10-CM | POA: Diagnosis not present

## 2017-10-11 NOTE — Patient Instructions (Signed)
It was a pleasure seeing you today in our clinic. Today we discussed ways to strengthen abdomin and hips. Here are the exercises we discussed:   1. Isometric abdominal exercises  2. 15-20 full sit ups 3. Start with 3x15 abdominal crunches, gradually increase repetitions.  4. Hip exercises are on the separate sheet  Gradually increase running in intervals with walking.

## 2017-10-11 NOTE — Progress Notes (Signed)
   HPI  CC: Return to exercise after surgery  Patient was previously an avid runner prior to a right hemicolectomy for cecal volvulus 9 weeks ago. She previously ran 3-4 miles 5 times per week. Since her surgery, she has lifted no more than 20-25 lbs and has been walking 3-4 miles per day. She has not had any difficulty with this activity level. She does endorse occasional mild pains in her abdomen that last only seconds and resolve on their own. Otherwise she has tolerated moderate activity. She attempted one set of sit-ups last week without difficulty.  Smoking Hx, Family Hx, Cc reviewed.  ROS: Per HPI; in addition no fever, no rash, no additional weakness, no additional numbness, no additional paresthesias, and no additional falls/injury.   Objective: BP 98/70   Ht 5' 5.5" (1.664 m)   Wt 110 lb (49.9 kg)   BMI 18.03 kg/m  Gen: NAD, well groomed, a/o x3, normal affect.  CV: Well-perfused. Warm.  Resp: Non-labored.  Abdomen:  Well-healing midline surgical scar. No hernia. Abdomen is soft and nontender without palpable masses.  No abdominal wall pain/tenderness with flexion of the leg or abdomen. Patient tolerates side plank, sit up and crunches bilaterally without pain. Hip, bilateral: Strength 5/5 in internal rotation, external rotation, extension, abduction and adduction bilaterally. Left abduction stronger than right.  Assessment and Plan:  Restarting Exercise s/p hemicolectomy:  Patient has had an uncomplicated post-surgical course over past 9 weeks and is currently tolerating walking daily. No red flags for hernia on history or exam. - discussed core exercises including isomeric abdominal exercises, sit ups, crunches, side planks and straight leg raises  - for abdominal exercises, recommend using a hand to support the abdomen - hip exercises also provided - gradual return to running, start with alternating jogging/walking  - follow up as needed  Everrett Coombe, MD PGY-2 [facility]  I observed and examined the patient with the resident and agree with assessment and plan.  Note reviewed and modified by me. Stefanie Libel, MD

## 2017-10-11 NOTE — Assessment & Plan Note (Signed)
Physical asessment to restart running  She is doing well and should be low risk to RT running  Gradually work on strength and more resistance with lifting

## 2017-10-27 DIAGNOSIS — Z0000 Encounter for general adult medical examination without abnormal findings: Secondary | ICD-10-CM | POA: Diagnosis not present

## 2017-10-27 DIAGNOSIS — Z9049 Acquired absence of other specified parts of digestive tract: Secondary | ICD-10-CM | POA: Diagnosis not present

## 2017-10-27 DIAGNOSIS — K562 Volvulus: Secondary | ICD-10-CM | POA: Diagnosis not present

## 2017-10-28 DIAGNOSIS — Z0000 Encounter for general adult medical examination without abnormal findings: Secondary | ICD-10-CM | POA: Diagnosis not present

## 2017-11-24 DIAGNOSIS — L570 Actinic keratosis: Secondary | ICD-10-CM | POA: Diagnosis not present

## 2017-11-24 DIAGNOSIS — L814 Other melanin hyperpigmentation: Secondary | ICD-10-CM | POA: Diagnosis not present

## 2017-11-24 DIAGNOSIS — D225 Melanocytic nevi of trunk: Secondary | ICD-10-CM | POA: Diagnosis not present

## 2017-11-24 DIAGNOSIS — L821 Other seborrheic keratosis: Secondary | ICD-10-CM | POA: Diagnosis not present

## 2017-12-12 DIAGNOSIS — Z1211 Encounter for screening for malignant neoplasm of colon: Secondary | ICD-10-CM | POA: Diagnosis not present

## 2017-12-20 DIAGNOSIS — Z1212 Encounter for screening for malignant neoplasm of rectum: Secondary | ICD-10-CM | POA: Diagnosis not present

## 2017-12-20 DIAGNOSIS — Z1211 Encounter for screening for malignant neoplasm of colon: Secondary | ICD-10-CM | POA: Diagnosis not present

## 2018-02-21 DIAGNOSIS — Z23 Encounter for immunization: Secondary | ICD-10-CM | POA: Diagnosis not present

## 2018-03-28 DIAGNOSIS — L570 Actinic keratosis: Secondary | ICD-10-CM | POA: Diagnosis not present

## 2018-03-28 DIAGNOSIS — L821 Other seborrheic keratosis: Secondary | ICD-10-CM | POA: Diagnosis not present

## 2018-07-06 ENCOUNTER — Ambulatory Visit (INDEPENDENT_AMBULATORY_CARE_PROVIDER_SITE_OTHER): Payer: 59 | Admitting: Sports Medicine

## 2018-07-06 ENCOUNTER — Encounter

## 2018-07-06 ENCOUNTER — Ambulatory Visit: Payer: Self-pay

## 2018-07-06 ENCOUNTER — Encounter: Payer: Self-pay | Admitting: Sports Medicine

## 2018-07-06 VITALS — BP 100/64 | Ht 66.0 in | Wt 110.0 lb

## 2018-07-06 DIAGNOSIS — M25551 Pain in right hip: Secondary | ICD-10-CM

## 2018-07-06 DIAGNOSIS — M6798 Unspecified disorder of synovium and tendon, other site: Secondary | ICD-10-CM | POA: Diagnosis not present

## 2018-07-06 DIAGNOSIS — M67951 Unspecified disorder of synovium and tendon, right thigh: Secondary | ICD-10-CM | POA: Insufficient documentation

## 2018-07-06 MED ORDER — NITROGLYCERIN 0.2 MG/HR TD PT24: MEDICATED_PATCH | TRANSDERMAL | 1 refills | Status: DC

## 2018-07-06 NOTE — Patient Instructions (Signed)

## 2018-07-06 NOTE — Progress Notes (Signed)
CC: hip pain  HPI: 53 yo female presenting for hip pain.  She is very active, and runs 5-6 days per week, 3-5 miles per day, and goes on daily walks and participates in yoga.  Since October, has been having worsening R > L hip pain and stiffness.  When asked where pain is worst, she points to R iliac crest.  She says it radiates in a bandlike distribution posterior from her iliac crest to her spine.  Pain and stiffness are worse on days when she is an active, especially after having sat for prolonged periods of time, and relieved during exercise.  Pain is also worse with "forward movements" (hip flexion, steeping forward) of lower extremities, and relieved when walking backward.  She is otherwise not tried any medication or stretches to relieve the pain and stiffness.   She also complains of "restless legs."  She describes involuntary twitching of her bilateral legs that occurs while she is inactive (in bed, in car) and particularly on her off-days from exercise.  Twitching is concerning to her but not painful.  ROS - no loss of sensation or strength of lower extremities, no incontinence of bladder or bowel.  BP 100/64   Ht 5\' 6"  (1.676 m)   Wt 110 lb (49.9 kg)   BMI 17.75 kg/m   Physical Exam: Gen: thin but strong appearing W F CV: RRR, no murmurs RESP: No increased work of breathing, lungs clear bilaterally ABD: +BS, nontender MSK: 1cm tender region at insertion of gluteus medius into iliac crest. No overlying skin changes.  Remainder of pelvis nontender.  Normal gait while jogging.  Full range of motion of internal / external rotation of hip bilaterally.  Full strength of flexion and extension of hip.  Bilateral sartorius strength normal.  Notable weakness of R gluteus medius on lateral hip abduction none noted on L lateral hip abducttion.  Ultrasound: Right Lateral Hip Gluteus medius and minimus tendons normal at insertion into greater trochanter At iliac crest there is a localized area  of hypoechoic change Calcifications noted Increased doppler flow  Impression:gluteus medius tendinopathy at insertion to iliac crest  Ultrasound and interpretation by Wolfgang Phoenix. Fields, MD   A/P:   Sutton is a 53 yo female with right hip pain, most consistent with a partial tendon rupture at the gluteus medius insertion into the iliac crest.  She has significant weakening of gluteus medius on lateral abduction of hip indicating decreased firing of muscle.  Ultrasound showing increasing fluid collection, calcification, blood flow, and thickening of gluteus medius tendon at insertion into the iliac crest.  Plan for conservative treatment as below.  Symptoms of restless leg syndrome may be due to low ferritin; will start on ferritin, and collect CBC in outpatient lab; if no improvement, consider dopaminergic origin of symptoms.  Partial gluteus medius tendon rupture - 1/4 nitroglycerin patch daily at site of injury - strengthening exercises daily - tylenol, motrin as needed for pain  Restless legs - daily Fe - outpatient CBC  I observed and examined the patient with the resident and agree with assessment and plan.  Note reviewed and modified by me. Ila Mcgill, MD

## 2018-07-06 NOTE — Assessment & Plan Note (Signed)
Abduction strength program with 4 exercises  NTG protocol  OK to run but continue the rehab and moderate effort  Reck in 6 weeks

## 2018-07-26 ENCOUNTER — Other Ambulatory Visit: Payer: Self-pay | Admitting: Obstetrics and Gynecology

## 2018-07-26 DIAGNOSIS — Z1231 Encounter for screening mammogram for malignant neoplasm of breast: Secondary | ICD-10-CM

## 2018-08-22 DIAGNOSIS — L309 Dermatitis, unspecified: Secondary | ICD-10-CM | POA: Diagnosis not present

## 2018-09-14 ENCOUNTER — Ambulatory Visit: Payer: 59

## 2018-09-19 DIAGNOSIS — Z681 Body mass index (BMI) 19 or less, adult: Secondary | ICD-10-CM | POA: Diagnosis not present

## 2018-09-19 DIAGNOSIS — Z01419 Encounter for gynecological examination (general) (routine) without abnormal findings: Secondary | ICD-10-CM | POA: Diagnosis not present

## 2018-10-26 ENCOUNTER — Ambulatory Visit
Admission: RE | Admit: 2018-10-26 | Discharge: 2018-10-26 | Disposition: A | Discharge status: Home / Self Care | Payer: 59 | Source: Ambulatory Visit | Attending: Obstetrics and Gynecology | Admitting: Obstetrics and Gynecology

## 2018-10-26 ENCOUNTER — Other Ambulatory Visit: Payer: Self-pay

## 2018-10-26 DIAGNOSIS — Z1231 Encounter for screening mammogram for malignant neoplasm of breast: Secondary | ICD-10-CM

## 2019-07-19 ENCOUNTER — Other Ambulatory Visit: Payer: Self-pay

## 2019-07-19 ENCOUNTER — Ambulatory Visit (INDEPENDENT_AMBULATORY_CARE_PROVIDER_SITE_OTHER): Payer: 59 | Admitting: Pediatrics

## 2019-07-19 VITALS — BP 110/76 | Ht 65.75 in | Wt 110.0 lb

## 2019-07-19 DIAGNOSIS — S29011A Strain of muscle and tendon of front wall of thorax, initial encounter: Secondary | ICD-10-CM

## 2019-07-19 NOTE — Patient Instructions (Signed)
Please rest from activities that aggravate your chest pain  Take ibuprofen/motrin 4 times daily or naproxen/aleve twice daily as needed for pain Some find that heating pads are helpful

## 2019-07-19 NOTE — Progress Notes (Signed)
Candice Hansen - 54 y.o. female MRN [id number]  Date of birth: 09/24/1965  SUBJECTIVE:   CC: chest pain after running   Candice Hansen is a 54 y.o. female with a history of PVCs (palpitations at time of 12/2013), syncope (2014) and partial rupture of the R gluteus medius in 06/2018 who presents for chest pain. Patient was in usual state of health until 2-3 weeks ago, when she developed sharp anterior left chest pain/tightness that would occur when she would run (usually on longer or more vigorous runs). It also happens occasionally when she does pushups (which is normally part of her pre-run routine, though she hasn't been able to secondary to pain for the past 2 weeks). She thinks that the pain first started after she was doing pushups one day. She at baseline runs multiple miles multiple times per week. The pain would last for about 6 hours after she would stop running. It does worsen with opening up her chest or moving her arms more. It does not worsen with deep breaths and is not associated with shortness of breath. No radiation towards the arm, neck, or spine. No associated numbness, tingling, light headedness, palpitations, headaches, blurry vision, or sweating. No associated feelings of anxiety. No associated syncope or pre-syncope. No sustained direct trauma in recent history.  She does report that she can sometimes reproduce the pain when she moves her arms or when she applies pressure to her chest wall. She has tried rest with some improvement but has not taken and OTC NSAIDs or other analgesics.   She does not have diabetes, hypertension, or hypercholesterolemia. No history of difficulty breathing or asthma.   ROS: as above  PMHx - Updated and reviewed.  Contributory factors include: Negative PSHx - Updated and reviewed.  Contributory factors include:  Negative FHx - Updated and reviewed.  Contributory factors include:  Negative Social Hx - Updated and reviewed. Contributory factors include:  Negative Medications - reviewed   Patient Active Problem List   Diagnosis Date Noted  . Tendinopathy of right gluteus medius 07/06/2018  . Cecal volvulus ([place]) 10/11/2017  . Premature ventricular contractions 12/30/2011  . KNEE PAIN, RIGHT 12/10/2009     DATA REVIEWED: n/a  PHYSICAL EXAM:  VS: BP:   HR: bpm  TEMP: ( )  RESP:   HT:    WT:   BMI:  PHYSICAL EXAM: Gen: NAD, alert, cooperative with exam, well-appearing HEENT: clear conjunctiva,  CV:  no edema, capillary refill brisk, normal rate Resp: non-labored Skin: no rashes, normal turgor  Neuro: no gross deficits.  Psych:  alert and oriented  Chest: No gross deformities. With tenderness to palpation of the L 3rd-5th intercostal spaces without associated TTP to the interspersed ribs. No tenderness to palpation of the sternum or xyphoid process. No tenderness to palpation of the costochonral joints. Mild TTP over the L pec major muscle. FROM about the L shoulder without reproduction of the pain. NVI  ASSESSMENT & PLAN:   Candice Hansen is a 54 y.o. female with a history of PVCs and syncope who presents with chest pain that started after doing pushups and worsens with activity/certain movements and on palpation. Exam and history are consistent with MSK chest pain, most likely intercostal muscle strain based on intercostal pain that spares the intervening ribs, though cannot rule out pectoralis muscle strain. She reassuringly does not display rad flag features concerning for cardiac or pulmonic etiologies of chest pain (ie: radiation to neck/arm, numbness, palpitations/presyncope, SOB, pleuritic  nature, etc). Supportive care with rest, hot packs, and NSAIDs PRN was reviewed. Also suggested trialing an ace bandage wrap to see if that may help pain with exercise. Counseled her to seek immediate medical attentions if CP is associated with syncope or any of the red flag features discussed above. To gradually re-introduce pushups when  feeling better. Return as needed.   Note written in conjunction with Karen Kays, MD  Allene Pyo, MD Sports Medicine Fellow [facility]  I was the preceptor for this visit and available for immediate consultation Shellia Cleverly, DO

## 2019-07-21 ENCOUNTER — Emergency Department (HOSPITAL_COMMUNITY): Payer: 59

## 2019-07-21 ENCOUNTER — Encounter (HOSPITAL_COMMUNITY): Payer: Self-pay

## 2019-07-21 ENCOUNTER — Other Ambulatory Visit: Payer: Self-pay

## 2019-07-21 ENCOUNTER — Emergency Department (HOSPITAL_COMMUNITY)
Admission: EM | Admit: 2019-07-21 | Discharge: 2019-07-21 | Disposition: A | Discharge status: Home / Self Care | Payer: 59 | Attending: Emergency Medicine | Admitting: Emergency Medicine

## 2019-07-21 DIAGNOSIS — R1011 Right upper quadrant pain: Secondary | ICD-10-CM | POA: Diagnosis present

## 2019-07-21 DIAGNOSIS — Z79899 Other long term (current) drug therapy: Secondary | ICD-10-CM | POA: Insufficient documentation

## 2019-07-21 LAB — CBC
HCT: 42.4 % (ref 36.0–46.0)
Hemoglobin: 14.8 g/dL (ref 12.0–15.0)
MCH: 33.6 pg (ref 26.0–34.0)
MCHC: 34.9 g/dL (ref 30.0–36.0)
MCV: 96.4 fL (ref 80.0–100.0)
Platelets: 184 10*3/uL (ref 150–400)
RBC: 4.4 MIL/uL (ref 3.87–5.11)
RDW: 12.2 % (ref 11.5–15.5)
WBC: 4 10*3/uL (ref 4.0–10.5)
nRBC: 0 % (ref 0.0–0.2)

## 2019-07-21 LAB — URINALYSIS, ROUTINE W REFLEX MICROSCOPIC
Bilirubin Urine: NEGATIVE
Glucose, UA: NEGATIVE mg/dL
Hgb urine dipstick: NEGATIVE
Ketones, ur: NEGATIVE mg/dL
Leukocytes,Ua: NEGATIVE
Nitrite: NEGATIVE
Protein, ur: NEGATIVE mg/dL
Specific Gravity, Urine: >1.046 — ABNORMAL HIGH (ref 1.005–1.030)
pH: 6 (ref 5.0–8.0)

## 2019-07-21 LAB — COMPREHENSIVE METABOLIC PANEL
ALT: 15 U/L (ref 0–44)
AST: 19 U/L (ref 15–41)
Albumin: 4.3 g/dL (ref 3.5–5.0)
Alkaline Phosphatase: 57 U/L (ref 38–126)
Anion gap: 10 (ref 5–15)
BUN: 16 mg/dL (ref 6–20)
CO2: 23 mmol/L (ref 22–32)
Calcium: 9.5 mg/dL (ref 8.9–10.3)
Chloride: 107 mmol/L (ref 98–111)
Creatinine, Ser: 0.86 mg/dL (ref 0.44–1.00)
GFR calc Af Amer: >60 mL/min (ref >60)
GFR calc non Af Amer: >60 mL/min (ref >60)
Glucose, Bld: 98 mg/dL (ref 70–99)
Potassium: 3.9 mmol/L (ref 3.5–5.1)
Sodium: 140 mmol/L (ref 135–145)
Total Bilirubin: 1.1 mg/dL (ref 0.3–1.2)
Total Protein: 7.3 g/dL (ref 6.5–8.1)

## 2019-07-21 LAB — LIPASE, BLOOD: Lipase: 29 U/L (ref 11–51)

## 2019-07-21 MED ORDER — FENTANYL CITRATE (PF) 100 MCG/2ML IJ SOLN
50.0000 ug | Freq: Once | INTRAMUSCULAR | Status: AC
  Administered 2019-07-21: 50 ug via INTRAVENOUS
  Filled 2019-07-21: qty 2

## 2019-07-21 MED ORDER — SODIUM CHLORIDE 0.9% FLUSH
3.0000 mL | Freq: Once | INTRAVENOUS | Status: AC
  Administered 2019-07-21: 3 mL via INTRAVENOUS

## 2019-07-21 MED ORDER — IOHEXOL 300 MG/ML  SOLN
100.0000 mL | Freq: Once | INTRAMUSCULAR | Status: AC | PRN
  Administered 2019-07-21: 100 mL via INTRAVENOUS

## 2019-07-21 NOTE — ED Provider Notes (Signed)
[facility] EMERGENCY DEPARTMENT Provider Note   CSN: [id number] Arrival date & time: 07/21/19  [id number]     History Chief Complaint  Patient presents with  . Abdominal Pain    Candice Hansen is a 54 y.o. female.  54 y.o female with a PMH of right colon resection presents to the ED with a chief complaint of right upper quadrant pain which began yesterday.  Patient describes this as a sharp sensation to the right upper quadrant without any radiation, describes this as feeling if somebody was taking and ax to her abdomen.  The pain is exacerbated with touch, reports this was worsening while she was sleeping and rolling over.  No alleviating factors, has not tried medication for improvement in symptoms.  She does endorse one episode of diarrhea on Thursday, has not had a bowel movement in the past 2 days.  She does have a prior history of a right colon resection due to 10 of her intestines.  She does not have any fever, nausea, vomiting, changes in her skin.  Does report occasional alcohol use, no illicit drug use, no urinary symptoms. LPM in July, reports being perimenopausal.   The history is provided by the patient.       Past Medical History:  Diagnosis Date  . Chest pain   . Palpitation   . Syncope     Patient Active Problem List   Diagnosis Date Noted  . Tendinopathy of right gluteus medius 07/06/2018  . Cecal volvulus ([place]) 10/11/2017  . Premature ventricular contractions 12/30/2011  . KNEE PAIN, RIGHT 12/10/2009    Past Surgical History:  Procedure Laterality Date  . OVARIAN CYST REMOVAL       OB History   No obstetric history on file.     No family history on file.  Social History   Tobacco Use  . Smoking status: Never Smoker  . Smokeless tobacco: Never Used  Substance Use Topics  . Alcohol use: No    Comment: social  . Drug use: No    Home Medications Prior to Admission medications   Medication Sig Start Date End Date Taking?  Authorizing Provider  Calcium Carbonate Antacid (TUMS PO) Take by mouth as needed.    Yes [provider]  ferrous sulfate 325 (65 FE) MG tablet Take 325 mg by mouth daily as needed.   Yes [provider]  nitroGLYCERIN (NITRODUR - DOSED IN MG/24 HR) 0.2 mg/hr patch Use 1/4 patch daily to the affected area. Patient not taking: Reported on 07/21/2019 07/06/18   Stefanie Libel, MD    Allergies    Penicillins  Review of Systems   Review of Systems  Constitutional: Negative for fever.  HENT: Negative for rhinorrhea and sore throat.   Respiratory: Negative for shortness of breath.   Cardiovascular: Negative for chest pain.  Gastrointestinal: Positive for abdominal pain and diarrhea. Negative for blood in stool, constipation, nausea and vomiting.  Genitourinary: Negative for flank pain.    Physical Exam Updated Vital Signs BP 99/73   Pulse (!) 50   Temp 97.7 F (36.5 C) (Oral)   Resp 20   Ht 5\' 6"  (1.676 m)   Wt 49.9 kg   SpO2 100%   BMI 17.75 kg/m   Physical Exam Vitals and nursing note reviewed.  Constitutional:      Appearance: She is well-developed.  HENT:     Head: Normocephalic and atraumatic.  Cardiovascular:     Rate and Rhythm: Normal rate.  Pulmonary:     Effort: Pulmonary effort is normal.     Breath sounds: No wheezing or rales.     Comments: Lungs are clear to auscultation without any wheezing, rhonchi, rales. Abdominal:     General: Abdomen is flat. A surgical scar is present. Bowel sounds are decreased.     Palpations: Abdomen is soft.     Tenderness: There is abdominal tenderness in the right upper quadrant. There is guarding. There is no right CVA tenderness, left CVA tenderness or rebound.     Hernia: No hernia is present.     Comments: Tenderness to palpation isolated to the right upper quadrant, bowel sounds are slightly diminished.  Surgical scar noted.  Skin:    General: Skin is warm and dry.  Neurological:     Mental Status: She is  alert.     ED Results / Procedures / Treatments   Labs (all labs ordered are listed, but only abnormal results are displayed) Labs Reviewed  URINALYSIS, ROUTINE W REFLEX MICROSCOPIC - Abnormal; Notable for the following components:      Result Value   Specific Gravity, Urine >1.046 (*)    All other components within normal limits  LIPASE, BLOOD  COMPREHENSIVE METABOLIC PANEL  CBC    EKG None  Radiology CT ABDOMEN PELVIS W CONTRAST  Result Date: 07/21/2019 CLINICAL DATA:  54 year old with right upper quadrant pain. EXAM: CT ABDOMEN AND PELVIS WITH CONTRAST TECHNIQUE: Multidetector CT imaging of the abdomen and pelvis was performed using the standard protocol following bolus administration of intravenous contrast. CONTRAST:  140mL OMNIPAQUE IOHEXOL 300 MG/ML  SOLN COMPARISON:  None. FINDINGS: Lower chest: Lung bases are clear. Hepatobiliary: Normal appearance of the liver, gallbladder and portal venous system. No biliary dilatation. Pancreas: Unremarkable. No pancreatic ductal dilatation or surrounding inflammatory changes. Spleen: Normal in size without focal abnormality. Adrenals/Urinary Tract: Normal adrenal glands. Limited evaluation for kidney stones because there is already a small amount of contrast in the calices. 1.7 cm cortical cyst in the right kidney lower pole. Evidence for additional cysts in the right kidney upper pole. Probable small cysts in the left kidney, some of which are too small to definitively characterize. No suspicious renal lesion. No hydronephrosis. Normal appearance of the urinary bladder. Stomach/Bowel: Surgical changes in the right lower abdomen near the cecum and distal small bowel. Multiple fluid-filled loops of small bowel in the pelvis and lower abdomen. No evidence for acute bowel inflammation or obstruction. Slightly atypical appearance of the mesentery on sequence 3, image 54 and this could be related to postoperative changes. Vascular/Lymphatic: No  significant vascular findings are present. No enlarged abdominal or pelvic lymph nodes. Reproductive: Uterus and bilateral adnexa are unremarkable. Other: Small amount of free fluid in the pelvis. Negative for free air. Musculoskeletal: Degenerative endplate changes along the superior endplate of L1. No acute bone abnormality. IMPRESSION: 1. No acute abnormality in the abdomen or pelvis. Small amount of free fluid in the pelvis is nonspecific and could be physiologic. 2. Postoperative changes in the right lower quadrant. Findings are likely related to an appendectomy but cannot exclude additional bowel surgery. Unusual configuration of the mesentery in lower abdomen may be related to postoperative changes. 3. Bilateral renal cysts. Electronically Signed   By: Markus Daft M.D.   On: 07/21/2019 13:48   US Abdomen Limited  Result Date: 07/21/2019 CLINICAL DATA:  Right upper quadrant pain with diarrhea. EXAM: ULTRASOUND ABDOMEN LIMITED RIGHT UPPER QUADRANT COMPARISON:  None. FINDINGS: Gallbladder: No gallstones  or wall thickening visualized. No sonographic Murphy sign noted by sonographer. Common bile duct: Diameter: 0.3 cm, within normal limits. Liver: No focal lesion identified. Within normal limits in parenchymal echogenicity. Portal vein is patent on color Doppler imaging with normal direction of blood flow towards the liver. Other: Incidentally noted 1.9 cm cyst in the right kidney. IMPRESSION: No sonographic finding to explain the patient's right upper quadrant pain. Incidentally noted 1.9 cm cyst in the right kidney. Electronically Signed   By: Audie Pinto M.D.   On: 07/21/2019 12:30    Procedures Procedures (including critical care time)  Medications Ordered in ED Medications  sodium chloride flush (NS) 0.9 % injection 3 mL (3 mLs Intravenous Given 07/21/19 1455)  iohexol (OMNIPAQUE) 300 MG/ML solution 100 mL (100 mLs Intravenous Contrast Given 07/21/19 1316)  fentaNYL (SUBLIMAZE) injection 50 mcg  (50 mcg Intravenous Given 07/21/19 1454)    ED Course  I have reviewed the triage vital signs and the nursing notes.  Pertinent labs & imaging results that were available during my care of the patient were reviewed by me and considered in my medical decision making (see chart for details).    MDM Rules/Calculators/A&P   Patient with a past medical history of right colon resection due to twisting of her intestines presents to the ED with complaints of right upper quadrant pain which began yesterday.  Exacerbated with movement and palpation, no alleviating factors.  No medication has been tried prior to.  No nausea, vomiting, jaundice.  No street of alcohol abuse.  Vitals are within normal limits, she is overall well-appearing, reports her last meal was yesterday, has not had anything to eat today.  During my evaluation she is significantly tender to the right upper quadrant, there is guarding on my exam.  No pain at McBurney's point.  Bowel sounds are somewhat diminished.  Surgical scar present from above bellybutton to the xiphoid.  No CVA tenderness, no urinary symptoms, lower suspicion for nephrolithiasis.  Fevers at home.  Differential diagnosis included but not limited to cholelithiasis, distraction.  Medication was offered, patient has declined this.  Will obtain right upper quadrant ultrasound to further evaluate.  Ultrasound of the abdomen showed: No sonographic finding to explain the patient's right upper quadrant  pain. Incidentally noted 1.9 cm cyst in the right kidney.     CT abdomen showed: 1. No acute abnormality in the abdomen or pelvis. Small amount of  free fluid in the pelvis is nonspecific and could be physiologic.  2. Postoperative changes in the right lower quadrant. Findings are  likely related to an appendectomy but cannot exclude additional  bowel surgery. Unusual configuration of the mesentery in lower  abdomen may be related to postoperative changes.  3. Bilateral  renal cysts.      Patient was given fentanyl to help with her pain.  No signs of obstruction on CT, no acute abnormality noted.  She remained stable, nontoxic appearing.  Reports improvement in her symptoms after receiving fentanyl.  Will patient follow-up with PCP in order to reevaluate her symptoms on Monday.  Patient shows and agrees to management, vitals within normal limits, patient stable for discharge.  Portions of this note were generated with [employer]. Dictation errors may occur despite best attempts at proofreading.  Final Clinical Impression(s) / ED Diagnoses Final diagnoses:  Right upper quadrant abdominal pain    Rx / DC Orders ED Discharge Orders    None       Janeece Fitting,  PA-C 07/21/19 [place], MD 07/21/19 1546

## 2019-07-21 NOTE — ED Notes (Signed)
Discharge instructions reviewed with patient. Pt verbalized understanding of need to follow up with PCP for further evaluation.

## 2019-07-21 NOTE — Discharge Instructions (Addendum)
Your laboratory results were within normal limits.   Your CT along with Ultrasound did not show any acute findings.   Follow-up with your primary care physician in order to obtain reevaluation of your symptoms.  You may alternate ibuprofen or tylenol to help with your symptoms.

## 2019-07-21 NOTE — ED Triage Notes (Signed)
Pt reports RUQ pain x1 day, diarrhea on Thursday. Denies N/V

## 2019-07-23 ENCOUNTER — Other Ambulatory Visit: Payer: Self-pay | Admitting: Obstetrics and Gynecology

## 2019-07-23 DIAGNOSIS — Z1231 Encounter for screening mammogram for malignant neoplasm of breast: Secondary | ICD-10-CM

## 2019-10-29 ENCOUNTER — Ambulatory Visit: Payer: 59

## 2019-11-26 ENCOUNTER — Other Ambulatory Visit: Payer: Self-pay

## 2019-11-26 ENCOUNTER — Ambulatory Visit
Admission: RE | Admit: 2019-11-26 | Discharge: 2019-11-26 | Disposition: A | Discharge status: Home / Self Care | Payer: 59 | Source: Ambulatory Visit | Attending: Obstetrics and Gynecology | Admitting: Obstetrics and Gynecology

## 2019-11-26 DIAGNOSIS — Z1231 Encounter for screening mammogram for malignant neoplasm of breast: Secondary | ICD-10-CM

## 2020-09-01 ENCOUNTER — Other Ambulatory Visit: Payer: Self-pay | Admitting: Obstetrics and Gynecology

## 2020-09-01 DIAGNOSIS — Z1231 Encounter for screening mammogram for malignant neoplasm of breast: Secondary | ICD-10-CM

## 2020-11-26 ENCOUNTER — Ambulatory Visit
Admission: RE | Admit: 2020-11-26 | Discharge: 2020-11-26 | Disposition: A | Discharge status: Home / Self Care | Payer: 59 | Source: Ambulatory Visit | Attending: Obstetrics and Gynecology | Admitting: Obstetrics and Gynecology

## 2020-11-26 ENCOUNTER — Other Ambulatory Visit: Payer: Self-pay

## 2020-11-26 DIAGNOSIS — Z1231 Encounter for screening mammogram for malignant neoplasm of breast: Secondary | ICD-10-CM

## 2021-01-09 ENCOUNTER — Other Ambulatory Visit: Payer: Self-pay | Admitting: Internal Medicine

## 2021-01-09 DIAGNOSIS — E782 Mixed hyperlipidemia: Secondary | ICD-10-CM

## 2021-01-21 LAB — COLOGUARD: COLOGUARD: NEGATIVE

## 2021-02-05 ENCOUNTER — Ambulatory Visit
Admission: RE | Admit: 2021-02-05 | Discharge: 2021-02-05 | Disposition: A | Discharge status: Home / Self Care | Payer: No Typology Code available for payment source | Source: Ambulatory Visit | Attending: Internal Medicine | Admitting: Internal Medicine

## 2021-02-05 DIAGNOSIS — E782 Mixed hyperlipidemia: Secondary | ICD-10-CM

## 2021-02-12 ENCOUNTER — Encounter: Payer: Self-pay | Admitting: Internal Medicine

## 2021-02-12 ENCOUNTER — Ambulatory Visit (INDEPENDENT_AMBULATORY_CARE_PROVIDER_SITE_OTHER): Payer: 59 | Admitting: Internal Medicine

## 2021-02-12 ENCOUNTER — Other Ambulatory Visit: Payer: Self-pay

## 2021-02-12 VITALS — BP 110/68 | HR 60 | Temp 98.5°F | Ht 65.0 in | Wt 110.2 lb

## 2021-02-12 DIAGNOSIS — Z801 Family history of malignant neoplasm of trachea, bronchus and lung: Secondary | ICD-10-CM | POA: Diagnosis not present

## 2021-02-12 DIAGNOSIS — Z23 Encounter for immunization: Secondary | ICD-10-CM | POA: Diagnosis not present

## 2021-02-12 DIAGNOSIS — R911 Solitary pulmonary nodule: Secondary | ICD-10-CM

## 2021-02-12 NOTE — Patient Instructions (Addendum)
ICD-10-CM   1. Left lower lobe pulmonary nodule  R91.1     2. Family history of neoplasm of lung  Z80.1       OVerall low - indeterminate PRobability for lung cancer but needs rule out  Plan  - PET scan  Followup  - Eric Form or Dr Chase Caller results review - telephone/video/face visit in next few weeks  - refer to Icard/Byrum if you need intervention based on PET results

## 2021-02-12 NOTE — Progress Notes (Signed)
OV 02/12/2021  Subjective:  Patient ID: Candice Hansen, female , DOB: [birth year] , age 55 y.o. , MRN: [id number] , ADDRESS: [address] [place] [address] PCP Deland Pretty, MD Patient Care Team: Deland Pretty, MD as PCP - General (Internal Medicine)  This Provider for this visit: Treatment Team:  Attending Provider: Brand Males, MD    02/12/2021 -   Chief Complaint  Patient presents with   Consult    Pt had a CT performed which showed a lung nodule. Denies any problems of SOB or coughing. States she will have some chest tightness after exercising or running.   New consult for left lower lobe lung nodule  HPI Candice Hansen 55 y.o. -healthy lady.  Lives around [place] area.  Few years ago moved from [facility] area to [place].  At the time to the knowledge the [place] area house did not have any radon exposure.  She she lost her dad to the lung cancer.  The dad was only 56 years of age.  He was a smoker and a Psychologist, sport and exercise.  Patient herself is a non-smoker although she passively smoked when she was young because of brother smoke in the house.  She and her husband are physically fit and of extremely healthy lives.  She says in the last few months while running periodically she has noticed some chest tightness.  She saw primary care physician.  Diagnosed with new hyperlipidemia.  Had coronary calcium CT scan.  Results are below.  I personally visualized it.  Found to have a left lower lobe 1 cm scarlike nodule.  There is no shortness of breath or coughing or wheezing or hemoptysis or fever or weight loss or chills.  A year ago she had a CT abdomen lung image.  The lung images do not go as high up as this nodule.  So we do not know whether is a new nodule or old nodule.  [employer] calculator shows low probably for lung cancer but she definitely has a family history.    CT Chest data 02/05/21  Narrative & Impression  CLINICAL DATA:  Hyperlipidemia    EXAM: CT CARDIAC CORONARY ARTERY CALCIUM SCORE   TECHNIQUE: Non-contrast imaging through the heart was performed using prospective ECG gating. Image post processing was performed on an independent workstation, allowing for quantitative analysis of the heart and coronary arteries. Note that this exam targets the heart and the chest was not imaged in its entirety.   COMPARISON:  None.   FINDINGS: CORONARY CALCIUM SCORES:   Left Main: 0   LAD: 55   LCx: 0   RCA: 0   Total Agatston Score: 55   MESA database percentile: 94   AORTA MEASUREMENTS:   Ascending Aorta: 33 mm   Descending Aorta: 21 mm   OTHER FINDINGS:   Heart is normal size. Aorta normal caliber. Nodular area in the left lower lobe measures up to 1.4 cm, associated with linear scarring. This could reflect nodular scarring, but recommend further evaluation. No effusions. Imaging into the upper abdomen demonstrates no acute findings. Chest wall soft tissues are unremarkable. No acute bony abnormality.   IMPRESSION: Total Agatston score: 55   [place] database percentile: 94   Scarring in the left lower lobe with associated area of nodularity along the scar measuring up to 1.4 cm. Consider one of the following in 3 months for both low-risk and high-risk individuals: (a) repeat chest CT, (b)  follow-up PET-CT, or (c) tissue sampling. This recommendation follows the consensus statement: Guidelines for Management of Incidental Pulmonary Nodules Detected on CT Images: From the Fleischner Society 2017; Radiology 2017; 284:228-243.     Electronically Signed   By: Rolm Baptise M.D.   On: 02/05/2021 12:06      No results found.    PFT  No flowsheet data found.     has a past medical history of Chest pain, Palpitation, and Syncope.   reports that she has never smoked. She has never used smokeless tobacco.  Past Surgical History:  Procedure Laterality Date   OVARIAN CYST REMOVAL      Allergies   Allergen Reactions   Penicillins Rash    Did it involve swelling of the face/tongue/throat, SOB, or low BP? N Did it involve sudden or severe rash/hives, skin peeling, or any reaction on the inside of your mouth or nose? Y Did you need to seek medical attention at a hospital or doctor's office? N When did it last happen?    20 years ago   If all above answers are "NO", may proceed with cephalosporin use.    Immunization History  Administered Date(s) Administered   Influenza Inj Mdck Quad With Preservative 02/21/2018   Influenza,inj,Quad PF,6+ Mos 03/04/2017, 02/12/2021   Influenza-Unspecified 02/14/2018   PFIZER(Purple Top)SARS-COV-2 Vaccination 08/02/2019, 08/26/2019, 04/20/2020, 11/11/2020   Tdap 11/29/2019   Zoster Recombinat (Shingrix) 11/01/2018, 04/04/2019    No family history on file.   Current Outpatient Medications:    Calcium Carbonate Antacid (TUMS PO), Take by mouth as needed. , Disp: , Rfl:    ferrous sulfate 325 (65 FE) MG tablet, Take 325 mg by mouth daily as needed., Disp: , Rfl:    loratadine (CLARITIN) 10 MG tablet, 1 tablet as needed, Disp: , Rfl:    rosuvastatin (CRESTOR) 10 MG tablet, 1 tablet, Disp: , Rfl:       Objective:   Vitals:   02/12/21 1549  BP: 110/68  Pulse: 60  Temp: 98.5 F (36.9 C)  TempSrc: Oral  SpO2: 100%  Weight: 110 lb 3.2 oz (50 kg)  Height: 5\' 5"  (1.651 m)    Estimated body mass index is 18.34 kg/m as calculated from the following:   Height as of this encounter: 5\' 5"  (1.651 m).   Weight as of this encounter: 110 lb 3.2 oz (50 kg).  @WEIGHTCHANGE @  [employer]   02/12/21 1549  Weight: 110 lb 3.2 oz (50 kg)     Physical Exam    General: No distress. Looks normal Neuro: Alert and Oriented x 3. GCS 15. Speech normal Psych: Pleasant Resp:  Barrel Chest - no.  Wheeze - no, Crackles - no, No overt respiratory distress CVS: Normal heart sounds. Murmurs - no Ext: Stigmata of Connective Tissue Disease - no HEENT:  Normal upper airway. PEERL +. No post nasal drip        Assessment:       ICD-10-CM   1. Left lower lobe pulmonary nodule  R91.1 NM PET Image Initial (PI) Skull Base To Thigh    2. Family history of neoplasm of lung  Z80.1 NM PET Image Initial (PI) Skull Base To Thigh    3. Need for immunization against influenza  Z23 Flu Vaccine QUAD 47mo+IM (Fluarix, Fluzone & Alfiuria Quad PF)         Plan:     Patient Instructions     ICD-10-CM   1. Left lower lobe pulmonary nodule  R91.1     2. Family history of neoplasm of lung  Z80.1       OVerall low - indeterminate PRobability for lung cancer but needs rule out  Plan  - PET scan  Followup  - Eric Form or Dr Chase Caller results review - telephone/video/face visit in next few weeks  - refer to Icard/Byrum if you need intervention based on PET results    SIGNATURE    Dr. Brand Males, M.D., F.C.C.P,  Pulmonary and [place] had [facility] Staff Physician, [place] Director - Interstitial Lung Disease  Program  Pulmonary [place] at [place], [place], [address]  Pager: [phone], If no answer or between  15:00h - 7:00h: call [phone] Telephone: [phone]  5:22 PM 02/12/2021

## 2021-02-27 ENCOUNTER — Other Ambulatory Visit: Payer: Self-pay

## 2021-02-27 ENCOUNTER — Ambulatory Visit (HOSPITAL_COMMUNITY)
Admission: RE | Admit: 2021-02-27 | Discharge: 2021-02-27 | Disposition: A | Discharge status: Home / Self Care | Payer: 59 | Source: Ambulatory Visit | Attending: Internal Medicine | Admitting: Internal Medicine

## 2021-02-27 DIAGNOSIS — Z801 Family history of malignant neoplasm of trachea, bronchus and lung: Secondary | ICD-10-CM | POA: Insufficient documentation

## 2021-02-27 DIAGNOSIS — R911 Solitary pulmonary nodule: Secondary | ICD-10-CM | POA: Insufficient documentation

## 2021-02-27 LAB — GLUCOSE, CAPILLARY: Glucose-Capillary: 94 mg/dL (ref 70–99)

## 2021-02-27 MED ORDER — FLUDEOXYGLUCOSE F - 18 (FDG) INJECTION
5.5000 | Freq: Once | INTRAVENOUS | Status: AC
  Administered 2021-02-27: 5.5 via INTRAVENOUS

## 2021-03-02 NOTE — Telephone Encounter (Signed)
Pt sent mychart message about her recent PET as she had been able to view results on her mychart. MR, please review and advise.

## 2021-03-03 NOTE — Telephone Encounter (Signed)
   PET scan suspicious for lung cancer  Plan  - Icard/Byrum ASAP next few to several days  - cancel appt with Dr Gaye Alken    Dr. Brand Males, M.D., F.C.C.P,  Pulmonary and Critical Care Medicine Staff Physician, [place] Director - Interstitial Lung Disease  Program  Pulmonary [place] at [place], [place], [address]  NPI Number:  NPI #[id number]  Pager: [phone], If no answer  -> Check AMION or Try [place] Telephone (clinical office): [phone] Telephone (research): [phone]  5:36 PM 03/03/2021    Narrative & Impression  CLINICAL DATA:  Initial treatment strategy for lung nodules   EXAM: NUCLEAR MEDICINE PET SKULL BASE TO THIGH   TECHNIQUE: 5.5 mCi F-18 FDG was injected intravenously. Full-ring PET imaging was performed from the skull base to thigh after the radiotracer. CT data was obtained and used for attenuation correction and anatomic localization.   Fasting blood glucose: 94 mg/dl   COMPARISON:  Cardiac calcium CT 02/05/2021   FINDINGS: Mediastinal blood pool activity: SUV max 1.8   Liver activity: SUV max NA   NECK: No significant abnormal hypermetabolic activity in this region.   Incidental CT findings: None   CHEST:   The 1.1 by 0.7 cm lesion of concern in the left lower lobe on image 35 of series 8 has maximum SUV of 2.3.   A left hilar lymph node has a maximum SUV of 7.1.   A left eccentric subcarinal lymph node measuring about 0.8 cm in short axis on image 72 of series 4 has a maximum SUV of 7.5.   Incidental CT findings: Minimal left anterior descending coronary artery atherosclerosis.   ABDOMEN/PELVIS: No significant abnormal hypermetabolic activity in this region.   Incidental CT findings: Fluid density right renal lesions appear photopenic compatible with cysts. 0.5 cm rim calcified structure anteriorly in the right kidney upper  pole on image 114 series 4, technically nonspecific although statistically likely to be a small complex cyst. 0.6 cm hyperdensity anteriorly in the left mid kidney on image 119 series 4, 70 Hounsfield units hence compatible with a small Bosniak category 2 complex cyst. Minimal abdominal aortic atherosclerotic calcification. Postoperative findings in the proximal right colon. Prominent stool throughout the colon favors constipation.   SKELETON: No significant abnormal hypermetabolic activity in this region.   Incidental CT findings: none   IMPRESSION: 1. The spiculated lesion of concern in the left lower lobe has a maximum SUV of 2.3, suspicious for malignancy. This suspicion is further raised by the presence of hypermetabolic left hilar and subcarinal adenopathy with maximum SUV of 7.1 and 7.5, respectively. 2. Aortic Atherosclerosis (ICD10-I70.0). Mild left anterior descending coronary artery atherosclerosis. 3.  Prominent stool throughout the colon favors constipation. 4. Bilateral benign renal cysts in addition to a small rim calcified structure anteriorly in the right kidney upper pole which is probably a small complex cyst although technically too small to characterize.     Electronically Signed   By: Van Clines M.D.   On: 03/02/2021 07:21

## 2021-03-04 NOTE — Telephone Encounter (Signed)
BI please advise if you want this pt added to your schedule for Friday afternoon.  Thanks   MR have you made the patient aware of these results?  thanks

## 2021-03-04 NOTE — Telephone Encounter (Signed)
Yes I spoke to patient yesteray. Pls get her in ASAP

## 2021-03-04 NOTE — Telephone Encounter (Signed)
I have called the pt and she is aware of appt with BI on 10/21 at 130.  She is aware to come around 115.

## 2021-03-06 ENCOUNTER — Encounter: Payer: Self-pay | Admitting: Pulmonary Disease

## 2021-03-06 ENCOUNTER — Other Ambulatory Visit: Payer: Self-pay

## 2021-03-06 ENCOUNTER — Telehealth: Payer: Self-pay | Admitting: Pulmonary Disease

## 2021-03-06 ENCOUNTER — Ambulatory Visit (INDEPENDENT_AMBULATORY_CARE_PROVIDER_SITE_OTHER): Payer: 59 | Admitting: Pulmonary Disease

## 2021-03-06 VITALS — BP 110/68 | HR 64 | Temp 98.4°F | Ht 65.0 in | Wt 112.0 lb

## 2021-03-06 DIAGNOSIS — R599 Enlarged lymph nodes, unspecified: Secondary | ICD-10-CM

## 2021-03-06 DIAGNOSIS — Z789 Other specified health status: Secondary | ICD-10-CM | POA: Diagnosis not present

## 2021-03-06 DIAGNOSIS — R942 Abnormal results of pulmonary function studies: Secondary | ICD-10-CM | POA: Diagnosis not present

## 2021-03-06 NOTE — Telephone Encounter (Signed)
Pt has been scheduled for 10/25 at 2:00 at [facility].  She will go for covid test on 10/24 in the morning.  Gave appt info to pt.

## 2021-03-06 NOTE — H&P (View-Only) (Signed)
Synopsis: Referred in Oct 2022 for lung nodule by Deland Pretty, MD  Subjective:   PATIENT ID: Candice Hansen GENDER: female DOB: 1965-08-07, MRN: [id number]  Chief Complaint  Patient presents with   Consult    Pt. Wants to talk about PET results.    PMH, high cholesterol, new mediastinal adenopathy, abnormal PET imaging concerning for malignancy.  Patient initially had a incidentally found left lower lobe lung nodule on a coronary calcium scoring CT.  Patient saw Dr. Chase Caller in follow-up.  Had a nuclear medicine PET scan ordered for evaluation.  This revealed low-level metabolic uptake within the nodule however had hypermetabolic SUV of 7 uptake within the hilum and subcarinal space.  Patient was referred today for evaluation for consideration of bronchoscopy.  Her father however was diagnosed with small cell lung cancer at the age of 73.  [place] and raise tobacco and smoked for several years.  No other significant family history of malignancy.  No personal history of malignancy.   Past Medical History:  Diagnosis Date   Chest pain    Palpitation    Syncope      No family history on file.   Past Surgical History:  Procedure Laterality Date   OVARIAN CYST REMOVAL      Social History   Socioeconomic History   Marital status: Married    Spouse name: Not on file   Number of children: Not on file   Years of education: Not on file   Highest education level: Not on file  Occupational History   Not on file  Tobacco Use   Smoking status: Never   Smokeless tobacco: Never  Substance and Sexual Activity   Alcohol use: No    Comment: social   Drug use: No   Sexual activity: Yes  Other Topics Concern   Not on file  Social History Narrative   Not on file   Social Determinants of Health   Financial Resource Strain: Not on file  Food Insecurity: Not on file  Transportation Needs: Not on file  Physical Activity: Not on file  Stress: Not on file   Social Connections: Not on file  Intimate Partner Violence: Not on file     Allergies  Allergen Reactions   Penicillins Rash    Did it involve swelling of the face/tongue/throat, SOB, or low BP? N Did it involve sudden or severe rash/hives, skin peeling, or any reaction on the inside of your mouth or nose? Y Did you need to seek medical attention at a hospital or doctor's office? N When did it last happen?    20 years ago   If all above answers are "NO", may proceed with cephalosporin use.     Outpatient Medications Prior to Visit  Medication Sig Dispense Refill   rosuvastatin (CRESTOR) 10 MG tablet Take 10 mg by mouth in the morning.     Calcium Carbonate Antacid (TUMS PO) Take by mouth as needed.      ferrous sulfate 325 (65 FE) MG tablet Take 325 mg by mouth daily as needed.     loratadine (CLARITIN) 10 MG tablet 1 tablet as needed     No facility-administered medications prior to visit.    Review of Systems  Constitutional:  Negative for chills, fever, malaise/fatigue and weight loss.  HENT:  Negative for hearing loss, sore throat and tinnitus.   Eyes:  Negative for blurred vision and double vision.  Respiratory:  Negative for  cough, hemoptysis, sputum production, shortness of breath, wheezing and stridor.   Cardiovascular:  Negative for chest pain, palpitations, orthopnea, leg swelling and PND.  Gastrointestinal:  Negative for abdominal pain, constipation, diarrhea, heartburn, nausea and vomiting.  Genitourinary:  Negative for dysuria, hematuria and urgency.  Musculoskeletal:  Negative for joint pain and myalgias.  Skin:  Negative for itching and rash.  Neurological:  Negative for dizziness, tingling, weakness and headaches.  Endo/Heme/Allergies:  Negative for environmental allergies. Does not bruise/bleed easily.  Psychiatric/Behavioral:  Negative for depression. The patient is nervous/anxious. The patient does not have insomnia.   All other systems reviewed and are  negative.   Objective:  Physical Exam Vitals reviewed.  Constitutional:      General: She is not in acute distress.    Appearance: She is well-developed.  HENT:     Head: Normocephalic and atraumatic.  Eyes:     General: No scleral icterus.    Conjunctiva/sclera: Conjunctivae normal.     Pupils: Pupils are equal, round, and reactive to light.  Neck:     Vascular: No JVD.     Trachea: No tracheal deviation.  Cardiovascular:     Rate and Rhythm: Normal rate and regular rhythm.     Heart sounds: Normal heart sounds. No murmur heard. Pulmonary:     Effort: Pulmonary effort is normal. No tachypnea, accessory muscle usage or respiratory distress.     Breath sounds: Normal breath sounds. No stridor. No wheezing, rhonchi or rales.  Abdominal:     General: Bowel sounds are normal. There is no distension.     Palpations: Abdomen is soft.     Tenderness: There is no abdominal tenderness.  Musculoskeletal:        General: No tenderness.     Cervical back: Neck supple.  Lymphadenopathy:     Cervical: No cervical adenopathy.  Skin:    General: Skin is warm and dry.     Capillary Refill: Capillary refill takes less than 2 seconds.     Findings: No rash.  Neurological:     Mental Status: She is alert and oriented to person, place, and time.  Psychiatric:        Behavior: Behavior normal.     Vitals:   03/06/21 1341  BP: 110/68  Pulse: 64  Temp: 98.4 F (36.9 C)  TempSrc: Oral  SpO2: 100%  Weight: 112 lb (50.8 kg)  Height: 5\' 5"  (1.651 m)   100% on RA BMI Readings from Last 3 Encounters:  03/06/21 18.64 kg/m  02/12/21 18.34 kg/m  07/21/19 17.75 kg/m   Wt Readings from Last 3 Encounters:  03/06/21 112 lb (50.8 kg)  02/12/21 110 lb 3.2 oz (50 kg)  07/21/19 110 lb (49.9 kg)     CBC    Component Value Date/Time   WBC 4.0 07/21/2019 1014   RBC 4.40 07/21/2019 1014   HGB 14.8 07/21/2019 1014   HCT 42.4 07/21/2019 1014   PLT 184 07/21/2019 1014   MCV 96.4  07/21/2019 1014   MCH 33.6 07/21/2019 1014   MCHC 34.9 07/21/2019 1014   RDW 12.2 07/21/2019 1014   LYMPHSABS 1.5 12/30/2011 0955   MONOABS 0.4 12/30/2011 0955   EOSABS 0.0 12/30/2011 0955   BASOSABS 0.0 12/30/2011 0955    Chest Imaging:  02/27/2021 nuclear medicine pet imaging: Left lower lobe nodule, hypermetabolic hilar and mediastinal adenopathy. The patient's images have been independently reviewed by me.    Pulmonary Functions Testing Results: No flowsheet data found.  FeNO:   Pathology:   Echocardiogram:   Heart Catheterization:     Assessment & Plan:     ICD-10-CM   1. Adenopathy  R59.9 Ambulatory referral to Pulmonology    Procedural/ Surgical Case Request: VIDEO BRONCHOSCOPY WITH ENDOBRONCHIAL ULTRASOUND    2. Abnormal PET scan, lung  R94.2     3. Non-smoker  Z78.9       Discussion:  This is a 55 year old female, non-smoker, family history of father with young age diagnosis of small cell lung cancer.  Presents today for evaluation of adenopathy within the chest, abnormal hypermetabolic subcarinal and hilar node as well as lung nodule.  Plan: Discussed abnormal imaging today in the office. We discussed risk benefits and alternatives of proceeding with bronchoscopy. Patient is agreeable to proceed with video bronchoscopy the endobronchial ultrasound transbronchial needle aspiration. We all discussed the possibility of needing going after the nodule however I think we should be able to get an answer from the mediastinal nodes that are hypermetabolic reactive. Of note there is a inverted relationship between the hypermetabolic activity of the nodule in the mediastinal nodes meaning that the mediastinal nodes are more hot on the PET scan.  This can be seen in granulomatous disease.  However we need to rule out concern for stage III malignancy.  Patient is agreeable to this plan. We will move forward with planned bronchoscopy on 03/10/2021.   Current  Outpatient Medications:    rosuvastatin (CRESTOR) 10 MG tablet, Take 10 mg by mouth in the morning., Disp: , Rfl:    acetaminophen (TYLENOL) 500 MG tablet, Take 1,000 mg by mouth every 6 (six) hours as needed (for pain.)., Disp: , Rfl:    calcium carbonate (TUMS - DOSED IN MG ELEMENTAL CALCIUM) 500 MG chewable tablet, Chew 1-2 tablets by mouth 2 (two) times daily as needed for indigestion or heartburn., Disp: , Rfl:    ibuprofen (ADVIL) 200 MG tablet, Take 400 mg by mouth every 8 (eight) hours as needed (pain.)., Disp: , Rfl:    Garner Nash, DO [facility] 03/06/2021 7:03 PM

## 2021-03-06 NOTE — Patient Instructions (Signed)
Thank you for visiting Dr. Valeta Harms at [facility]. Today we recommend the following:  Orders Placed This Encounter  Procedures   Procedural/ Surgical Case Request: [place]   Ambulatory referral to Pulmonology   Planned bronchoscopy 03/10/2021  Return in about 10 days (around 03/16/2021), or Eric Form, NP.    Please do your part to reduce the spread of COVID-19.

## 2021-03-06 NOTE — Progress Notes (Signed)
Synopsis: Referred in Oct 2022 for lung nodule by Candice Pretty, MD  Subjective:   PATIENT ID: Candice Hansen GENDER: female DOB: [birth year], MRN: [id number]  Chief Complaint  Patient presents with   Consult    Pt. Wants to talk about PET results.    PMH, high cholesterol, new mediastinal adenopathy, abnormal PET imaging concerning for malignancy.  Patient initially had a incidentally found left lower lobe lung nodule on a coronary calcium scoring CT.  Patient saw Dr. Chase Caller in follow-up.  Had a nuclear medicine PET scan ordered for evaluation.  This revealed low-level metabolic uptake within the nodule however had hypermetabolic SUV of 7 uptake within the hilum and subcarinal space.  Patient was referred today for evaluation for consideration of bronchoscopy.  Her father however was diagnosed with small cell lung cancer at the age of 85.  [place] and raise tobacco and smoked for several years.  No other significant family history of malignancy.  No personal history of malignancy.   Past Medical History:  Diagnosis Date   Chest pain    Palpitation    Syncope      No family history on file.   Past Surgical History:  Procedure Laterality Date   OVARIAN CYST REMOVAL      Social History   Socioeconomic History   Marital status: Married    Spouse name: Not on file   Number of children: Not on file   Years of education: Not on file   Highest education level: Not on file  Occupational History   Not on file  Tobacco Use   Smoking status: Never   Smokeless tobacco: Never  Substance and Sexual Activity   Alcohol use: No    Comment: social   Drug use: No   Sexual activity: Yes  Other Topics Concern   Not on file  Social History Narrative   Not on file   Social Determinants of Health   Financial Resource Strain: Not on file  Food Insecurity: Not on file  Transportation Needs: Not on file  Physical Activity: Not on file  Stress: Not on file   Social Connections: Not on file  Intimate Partner Violence: Not on file     Allergies  Allergen Reactions   Penicillins Rash    Did it involve swelling of the face/tongue/throat, SOB, or low BP? N Did it involve sudden or severe rash/hives, skin peeling, or any reaction on the inside of your mouth or nose? Y Did you need to seek medical attention at a hospital or doctor's office? N When did it last happen?    20 years ago   If all above answers are "NO", may proceed with cephalosporin use.     Outpatient Medications Prior to Visit  Medication Sig Dispense Refill   rosuvastatin (CRESTOR) 10 MG tablet Take 10 mg by mouth in the morning.     Calcium Carbonate Antacid (TUMS PO) Take by mouth as needed.      ferrous sulfate 325 (65 FE) MG tablet Take 325 mg by mouth daily as needed.     loratadine (CLARITIN) 10 MG tablet 1 tablet as needed     No facility-administered medications prior to visit.    Review of Systems  Constitutional:  Negative for chills, fever, malaise/fatigue and weight loss.  HENT:  Negative for hearing loss, sore throat and tinnitus.   Eyes:  Negative for blurred vision and double vision.  Respiratory:  Negative for  cough, hemoptysis, sputum production, shortness of breath, wheezing and stridor.   Cardiovascular:  Negative for chest pain, palpitations, orthopnea, leg swelling and PND.  Gastrointestinal:  Negative for abdominal pain, constipation, diarrhea, heartburn, nausea and vomiting.  Genitourinary:  Negative for dysuria, hematuria and urgency.  Musculoskeletal:  Negative for joint pain and myalgias.  Skin:  Negative for itching and rash.  Neurological:  Negative for dizziness, tingling, weakness and headaches.  Endo/Heme/Allergies:  Negative for environmental allergies. Does not bruise/bleed easily.  Psychiatric/Behavioral:  Negative for depression. The patient is nervous/anxious. The patient does not have insomnia.   All other systems reviewed and are  negative.   Objective:  Physical Exam Vitals reviewed.  Constitutional:      General: She is not in acute distress.    Appearance: She is well-developed.  HENT:     Head: Normocephalic and atraumatic.  Eyes:     General: No scleral icterus.    Conjunctiva/sclera: Conjunctivae normal.     Pupils: Pupils are equal, round, and reactive to light.  Neck:     Vascular: No JVD.     Trachea: No tracheal deviation.  Cardiovascular:     Rate and Rhythm: Normal rate and regular rhythm.     Heart sounds: Normal heart sounds. No murmur heard. Pulmonary:     Effort: Pulmonary effort is normal. No tachypnea, accessory muscle usage or respiratory distress.     Breath sounds: Normal breath sounds. No stridor. No wheezing, rhonchi or rales.  Abdominal:     General: Bowel sounds are normal. There is no distension.     Palpations: Abdomen is soft.     Tenderness: There is no abdominal tenderness.  Musculoskeletal:        General: No tenderness.     Cervical back: Neck supple.  Lymphadenopathy:     Cervical: No cervical adenopathy.  Skin:    General: Skin is warm and dry.     Capillary Refill: Capillary refill takes less than 2 seconds.     Findings: No rash.  Neurological:     Mental Status: She is alert and oriented to person, place, and time.  Psychiatric:        Behavior: Behavior normal.     Vitals:   03/06/21 1341  BP: 110/68  Pulse: 64  Temp: 98.4 F (36.9 C)  TempSrc: Oral  SpO2: 100%  Weight: 112 lb (50.8 kg)  Height: 5\' 5"  (1.651 m)   100% on RA BMI Readings from Last 3 Encounters:  03/06/21 18.64 kg/m  02/12/21 18.34 kg/m  07/21/19 17.75 kg/m   Wt Readings from Last 3 Encounters:  03/06/21 112 lb (50.8 kg)  02/12/21 110 lb 3.2 oz (50 kg)  07/21/19 110 lb (49.9 kg)     CBC    Component Value Date/Time   WBC 4.0 07/21/2019 1014   RBC 4.40 07/21/2019 1014   HGB 14.8 07/21/2019 1014   HCT 42.4 07/21/2019 1014   PLT 184 07/21/2019 1014   MCV 96.4  07/21/2019 1014   MCH 33.6 07/21/2019 1014   MCHC 34.9 07/21/2019 1014   RDW 12.2 07/21/2019 1014   LYMPHSABS 1.5 12/30/2011 0955   MONOABS 0.4 12/30/2011 0955   EOSABS 0.0 12/30/2011 0955   BASOSABS 0.0 12/30/2011 0955    Chest Imaging:  02/27/2021 nuclear medicine pet imaging: Left lower lobe nodule, hypermetabolic hilar and mediastinal adenopathy. The patient's images have been independently reviewed by me.    Pulmonary Functions Testing Results: No flowsheet data found.  FeNO:   Pathology:   Echocardiogram:   Heart Catheterization:     Assessment & Plan:     ICD-10-CM   1. Adenopathy  R59.9 Ambulatory referral to Pulmonology    Procedural/ Surgical Case Request: VIDEO BRONCHOSCOPY WITH ENDOBRONCHIAL ULTRASOUND    2. Abnormal PET scan, lung  R94.2     3. Non-smoker  Z78.9       Discussion:  This is a 55 year old female, non-smoker, family history of father with young age diagnosis of small cell lung cancer.  Presents today for evaluation of adenopathy within the chest, abnormal hypermetabolic subcarinal and hilar node as well as lung nodule.  Plan: Discussed abnormal imaging today in the office. We discussed risk benefits and alternatives of proceeding with bronchoscopy. Patient is agreeable to proceed with video bronchoscopy the endobronchial ultrasound transbronchial needle aspiration. We all discussed the possibility of needing going after the nodule however I think we should be able to get an answer from the mediastinal nodes that are hypermetabolic reactive. Of note there is a inverted relationship between the hypermetabolic activity of the nodule in the mediastinal nodes meaning that the mediastinal nodes are more hot on the PET scan.  This can be seen in granulomatous disease.  However we need to rule out concern for stage III malignancy.  Patient is agreeable to this plan. We will move forward with planned bronchoscopy on 03/10/2021.   Current  Outpatient Medications:    rosuvastatin (CRESTOR) 10 MG tablet, Take 10 mg by mouth in the morning., Disp: , Rfl:    acetaminophen (TYLENOL) 500 MG tablet, Take 1,000 mg by mouth every 6 (six) hours as needed (for pain.)., Disp: , Rfl:    calcium carbonate (TUMS - DOSED IN MG ELEMENTAL CALCIUM) 500 MG chewable tablet, Chew 1-2 tablets by mouth 2 (two) times daily as needed for indigestion or heartburn., Disp: , Rfl:    ibuprofen (ADVIL) 200 MG tablet, Take 400 mg by mouth every 8 (eight) hours as needed (pain.)., Disp: , Rfl:    Garner Nash, DO [facility] 03/06/2021 7:03 PM

## 2021-03-06 NOTE — H&P (View-Only) (Signed)
Synopsis: Referred in Oct 2022 for lung nodule by Deland Pretty, MD  Subjective:   PATIENT ID: Candice Hansen GENDER: female DOB: [birth year], MRN: [id number]  Chief Complaint  Patient presents with   Consult    Pt. Wants to talk about PET results.    PMH, high cholesterol, new mediastinal adenopathy, abnormal PET imaging concerning for malignancy.  Patient initially had a incidentally found left lower lobe lung nodule on a coronary calcium scoring CT.  Patient saw Dr. Chase Caller in follow-up.  Had a nuclear medicine PET scan ordered for evaluation.  This revealed low-level metabolic uptake within the nodule however had hypermetabolic SUV of 7 uptake within the hilum and subcarinal space.  Patient was referred today for evaluation for consideration of bronchoscopy.  Her father however was diagnosed with small cell lung cancer at the age of 36.  He [place] up [place] a [place] and raise tobacco and smoked for several years.  No other significant family history of malignancy.  No personal history of malignancy.   Past Medical History:  Diagnosis Date   Chest pain    Palpitation    Syncope      No family history on file.   Past Surgical History:  Procedure Laterality Date   OVARIAN CYST REMOVAL      Social History   Socioeconomic History   Marital status: Married    Spouse name: Not on file   Number of children: Not on file   Years of education: Not on file   Highest education level: Not on file  Occupational History   Not on file  Tobacco Use   Smoking status: Never   Smokeless tobacco: Never  Substance and Sexual Activity   Alcohol use: No    Comment: social   Drug use: No   Sexual activity: Yes  Other Topics Concern   Not on file  Social History Narrative   Not on file   Social Determinants of Health   Financial Resource Strain: Not on file  Food Insecurity: Not on file  Transportation Needs: Not on file  Physical Activity: Not on file  Stress: Not on file   Social Connections: Not on file  Intimate Partner Violence: Not on file     Allergies  Allergen Reactions   Penicillins Rash    Did it involve swelling of the face/tongue/throat, SOB, or low BP? N Did it involve sudden or severe rash/hives, skin peeling, or any reaction on the inside of your mouth or nose? Y Did you need to seek medical attention at a hospital or doctor's office? N When did it last happen?    20 years ago   If all above answers are "NO", may proceed with cephalosporin use.     Outpatient Medications Prior to Visit  Medication Sig Dispense Refill   rosuvastatin (CRESTOR) 10 MG tablet Take 10 mg by mouth in the morning.     Calcium Carbonate Antacid (TUMS PO) Take by mouth as needed.      ferrous sulfate 325 (65 FE) MG tablet Take 325 mg by mouth daily as needed.     loratadine (CLARITIN) 10 MG tablet 1 tablet as needed     No facility-administered medications prior to visit.    Review of Systems  Constitutional:  Negative for chills, fever, malaise/fatigue and weight loss.  HENT:  Negative for hearing loss, sore throat and tinnitus.   Eyes:  Negative for blurred vision and double vision.  Respiratory:  Negative for  cough, hemoptysis, sputum production, shortness of breath, wheezing and stridor.   Cardiovascular:  Negative for chest pain, palpitations, orthopnea, leg swelling and PND.  Gastrointestinal:  Negative for abdominal pain, constipation, diarrhea, heartburn, nausea and vomiting.  Genitourinary:  Negative for dysuria, hematuria and urgency.  Musculoskeletal:  Negative for joint pain and myalgias.  Skin:  Negative for itching and rash.  Neurological:  Negative for dizziness, tingling, weakness and headaches.  Endo/Heme/Allergies:  Negative for environmental allergies. Does not bruise/bleed easily.  Psychiatric/Behavioral:  Negative for depression. The patient is nervous/anxious. The patient does not have insomnia.   All other systems reviewed and are  negative.   Objective:  Physical Exam Vitals reviewed.  Constitutional:      General: She is not in acute distress.    Appearance: She is well-developed.  HENT:     Head: Normocephalic and atraumatic.  Eyes:     General: No scleral icterus.    Conjunctiva/sclera: Conjunctivae normal.     Pupils: Pupils are equal, round, and reactive to light.  Neck:     Vascular: No JVD.     Trachea: No tracheal deviation.  Cardiovascular:     Rate and Rhythm: Normal rate and regular rhythm.     Heart sounds: Normal heart sounds. No murmur heard. Pulmonary:     Effort: Pulmonary effort is normal. No tachypnea, accessory muscle usage or respiratory distress.     Breath sounds: Normal breath sounds. No stridor. No wheezing, rhonchi or rales.  Abdominal:     General: Bowel sounds are normal. There is no distension.     Palpations: Abdomen is soft.     Tenderness: There is no abdominal tenderness.  Musculoskeletal:        General: No tenderness.     Cervical back: Neck supple.  Lymphadenopathy:     Cervical: No cervical adenopathy.  Skin:    General: Skin is warm and dry.     Capillary Refill: Capillary refill takes less than 2 seconds.     Findings: No rash.  Neurological:     Mental Status: She is alert and oriented to person, place, and time.  Psychiatric:        Behavior: Behavior normal.     Vitals:   03/06/21 1341  BP: 110/68  Pulse: 64  Temp: 98.4 F (36.9 C)  TempSrc: Oral  SpO2: 100%  Weight: 112 lb (50.8 kg)  Height: 5\' 5"  (1.651 m)   100% on RA BMI Readings from Last 3 Encounters:  03/06/21 18.64 kg/m  02/12/21 18.34 kg/m  07/21/19 17.75 kg/m   Wt Readings from Last 3 Encounters:  03/06/21 112 lb (50.8 kg)  02/12/21 110 lb 3.2 oz (50 kg)  07/21/19 110 lb (49.9 kg)     CBC    Component Value Date/Time   WBC 4.0 07/21/2019 1014   RBC 4.40 07/21/2019 1014   HGB 14.8 07/21/2019 1014   HCT 42.4 07/21/2019 1014   PLT 184 07/21/2019 1014   MCV 96.4  07/21/2019 1014   MCH 33.6 07/21/2019 1014   MCHC 34.9 07/21/2019 1014   RDW 12.2 07/21/2019 1014   LYMPHSABS 1.5 12/30/2011 0955   MONOABS 0.4 12/30/2011 0955   EOSABS 0.0 12/30/2011 0955   BASOSABS 0.0 12/30/2011 0955    Chest Imaging:  02/27/2021 nuclear medicine pet imaging: Left lower lobe nodule, hypermetabolic hilar and mediastinal adenopathy. The patient's images have been independently reviewed by me.    Pulmonary Functions Testing Results: No flowsheet data found.  FeNO:   Pathology:   Echocardiogram:   Heart Catheterization:     Assessment & Plan:     ICD-10-CM   1. Adenopathy  R59.9 Ambulatory referral to Pulmonology    Procedural/ Surgical Case Request: VIDEO BRONCHOSCOPY WITH ENDOBRONCHIAL ULTRASOUND    2. Abnormal PET scan, lung  R94.2     3. Non-smoker  Z78.9       Discussion:  This is a 55 year old female, non-smoker, family history of father with young age diagnosis of small cell lung cancer.  Presents today for evaluation of adenopathy within the chest, abnormal hypermetabolic subcarinal and hilar node as well as lung nodule.  Plan: Discussed abnormal imaging today in the office. We discussed risk benefits and alternatives of proceeding with bronchoscopy. Patient is agreeable to proceed with video bronchoscopy the endobronchial ultrasound transbronchial needle aspiration. We all discussed the possibility of needing going after the nodule however I think we should be able to get an answer from the mediastinal nodes that are hypermetabolic reactive. Of note there is a inverted relationship between the hypermetabolic activity of the nodule in the mediastinal nodes meaning that the mediastinal nodes are more hot on the PET scan.  This can be seen in granulomatous disease.  However we need to rule out concern for stage III malignancy.  Patient is agreeable to this plan. We will move forward with planned bronchoscopy on 03/10/2021.   Current  Outpatient Medications:    rosuvastatin (CRESTOR) 10 MG tablet, Take 10 mg by mouth in the morning., Disp: , Rfl:    acetaminophen (TYLENOL) 500 MG tablet, Take 1,000 mg by mouth every 6 (six) hours as needed (for pain.)., Disp: , Rfl:    calcium carbonate (TUMS - DOSED IN MG ELEMENTAL CALCIUM) 500 MG chewable tablet, Chew 1-2 tablets by mouth 2 (two) times daily as needed for indigestion or heartburn., Disp: , Rfl:    ibuprofen (ADVIL) 200 MG tablet, Take 400 mg by mouth every 8 (eight) hours as needed (pain.)., Disp: , Rfl:    Garner Nash, DO [facility] 03/06/2021 7:03 PM

## 2021-03-09 ENCOUNTER — Other Ambulatory Visit: Payer: Self-pay | Admitting: Pulmonary Disease

## 2021-03-09 ENCOUNTER — Other Ambulatory Visit: Payer: Self-pay

## 2021-03-09 ENCOUNTER — Encounter (HOSPITAL_COMMUNITY): Payer: Self-pay | Admitting: Pulmonary Disease

## 2021-03-09 LAB — SARS CORONAVIRUS 2 (TAT 6-24 HRS): SARS Coronavirus 2: NEGATIVE

## 2021-03-09 NOTE — Progress Notes (Signed)
Candice Hansen denies chest pain or shortness of breath. Patient denies having any s/s of Covid in her household.  Patient denies any known exposure to Covid.   PCP is Dr. Deland Pretty. [name] saw Dr Sondra Come in 2013 after a syncopal episode, patient had follow ups until 2015.  Dr Sondra Come told patient in 2015, that she did not have to return , unless there was an issue.  I instructed patient to shower with antibiotic soap, if it is available.  Dry off with a clean towel. Do not put lotion, powder, cologne or deodorant or makeup.No jewelry or piercings. Men may shave their face and neck. Woman should not shave. No nail polish, artificial or acrylic nails. Wear clean clothes, brush your teeth. Glasses, contact lens,dentures or partials may not be worn in the OR. If you need to wear them, please bring a case for glasses, do not wear contacts or bring a case, the hospital does not have contact cases, dentures or partials will have to be removed , make sure they are clean, we will provide a denture cup to put them in. You will need some one to drive you home and a responsible person over the age of 56 to stay with you for the first 24 hours after surgery.

## 2021-03-09 NOTE — Telephone Encounter (Signed)
Received the following message from patient:   "Hello Dr. Valeta Harms, Thank you so much for your care.  Since the danger and risk of this potential cancer are so high, I want to try to self refer to either [facility] or [facility] to try to get in their clinical trials.  I would love if you would also refer my case so that we get your normal referral and hopefully a 2nd opinion from [facility] at the same time.  Also, we want to make sure that you are sending the tissue for genomic testing.  We feel sure you are but wanted to confirm with you.  We are praying for good news on Tuesday but are trying to prepare for the worst. Best regards, Candice Hansen"  BI, can you please advise? Thanks!

## 2021-03-10 ENCOUNTER — Ambulatory Visit (HOSPITAL_COMMUNITY)
Admission: RE | Admit: 2021-03-10 | Discharge: 2021-03-10 | Disposition: A | Discharge status: Home / Self Care | Payer: 59 | Attending: Pulmonary Disease | Admitting: Pulmonary Disease

## 2021-03-10 ENCOUNTER — Encounter (HOSPITAL_COMMUNITY): Payer: Self-pay | Admitting: Pulmonary Disease

## 2021-03-10 ENCOUNTER — Ambulatory Visit (HOSPITAL_COMMUNITY): Payer: 59 | Admitting: Anesthesiology

## 2021-03-10 ENCOUNTER — Encounter (HOSPITAL_COMMUNITY)
Admission: RE | Disposition: A | Discharge status: Home / Self Care | Payer: Self-pay | Source: Home / Self Care | Attending: Pulmonary Disease

## 2021-03-10 ENCOUNTER — Other Ambulatory Visit: Payer: Self-pay

## 2021-03-10 DIAGNOSIS — R599 Enlarged lymph nodes, unspecified: Secondary | ICD-10-CM | POA: Diagnosis not present

## 2021-03-10 DIAGNOSIS — R590 Localized enlarged lymph nodes: Secondary | ICD-10-CM | POA: Insufficient documentation

## 2021-03-10 DIAGNOSIS — Z791 Long term (current) use of non-steroidal anti-inflammatories (NSAID): Secondary | ICD-10-CM | POA: Diagnosis not present

## 2021-03-10 DIAGNOSIS — Z880 Allergy status to penicillin: Secondary | ICD-10-CM | POA: Diagnosis not present

## 2021-03-10 DIAGNOSIS — Z801 Family history of malignant neoplasm of trachea, bronchus and lung: Secondary | ICD-10-CM | POA: Insufficient documentation

## 2021-03-10 DIAGNOSIS — Z79899 Other long term (current) drug therapy: Secondary | ICD-10-CM | POA: Insufficient documentation

## 2021-03-10 DIAGNOSIS — E7800 Pure hypercholesterolemia, unspecified: Secondary | ICD-10-CM | POA: Insufficient documentation

## 2021-03-10 HISTORY — PX: FINE NEEDLE ASPIRATION: SHX5430

## 2021-03-10 HISTORY — PX: VIDEO BRONCHOSCOPY WITH ENDOBRONCHIAL ULTRASOUND: SHX6177

## 2021-03-10 HISTORY — DX: Cardiac murmur, unspecified: R01.1

## 2021-03-10 LAB — POCT I-STAT, CHEM 8
BUN: 12 mg/dL (ref 6–20)
Calcium, Ion: 1.19 mmol/L (ref 1.15–1.40)
Chloride: 104 mmol/L (ref 98–111)
Creatinine, Ser: 0.8 mg/dL (ref 0.44–1.00)
Glucose, Bld: 86 mg/dL (ref 70–99)
HCT: 40 % (ref 36.0–46.0)
Hemoglobin: 13.6 g/dL (ref 12.0–15.0)
Potassium: 4.4 mmol/L (ref 3.5–5.1)
Sodium: 140 mmol/L (ref 135–145)
TCO2: 25 mmol/L (ref 22–32)

## 2021-03-10 SURGERY — BRONCHOSCOPY, WITH EBUS
Anesthesia: General | Laterality: Bilateral

## 2021-03-10 MED ORDER — DEXAMETHASONE SODIUM PHOSPHATE 10 MG/ML IJ SOLN
INTRAMUSCULAR | Status: DC | PRN
Start: 2021-03-10 — End: 2021-03-10
  Administered 2021-03-10: 5 mg via INTRAVENOUS

## 2021-03-10 MED ORDER — LACTATED RINGERS IV SOLN: INTRAVENOUS | Status: DC

## 2021-03-10 MED ORDER — ROCURONIUM BROMIDE 10 MG/ML (PF) SYRINGE
PREFILLED_SYRINGE | INTRAVENOUS | Status: DC | PRN
  Administered 2021-03-10: 20 mg via INTRAVENOUS
  Administered 2021-03-10: 50 mg via INTRAVENOUS

## 2021-03-10 MED ORDER — CHLORHEXIDINE GLUCONATE 0.12 % MT SOLN
OROMUCOSAL | Status: AC
  Administered 2021-03-10: 15 mL via OROMUCOSAL
  Filled 2021-03-10: qty 15

## 2021-03-10 MED ORDER — MIDAZOLAM HCL 2 MG/2ML IJ SOLN
INTRAMUSCULAR | Status: DC | PRN
  Administered 2021-03-10: 2 mg via INTRAVENOUS

## 2021-03-10 MED ORDER — CHLORHEXIDINE GLUCONATE 0.12 % MT SOLN: 15.0000 mL | Freq: Once | OROMUCOSAL | Status: AC

## 2021-03-10 MED ORDER — ONDANSETRON HCL 4 MG/2ML IJ SOLN
INTRAMUSCULAR | Status: DC | PRN
  Administered 2021-03-10: 4 mg via INTRAVENOUS

## 2021-03-10 MED ORDER — SUGAMMADEX SODIUM 200 MG/2ML IV SOLN
INTRAVENOUS | Status: DC | PRN
  Administered 2021-03-10: 200 mg via INTRAVENOUS

## 2021-03-10 MED ORDER — PROPOFOL 10 MG/ML IV BOLUS
INTRAVENOUS | Status: DC | PRN
  Administered 2021-03-10: 100 mg via INTRAVENOUS

## 2021-03-10 MED ORDER — FENTANYL CITRATE (PF) 100 MCG/2ML IJ SOLN
INTRAMUSCULAR | Status: DC | PRN
  Administered 2021-03-10 (×2): 50 ug via INTRAVENOUS

## 2021-03-10 MED ORDER — FENTANYL CITRATE (PF) 250 MCG/5ML IJ SOLN: INTRAMUSCULAR | Status: DC | PRN

## 2021-03-10 MED ORDER — LIDOCAINE 2% (20 MG/ML) 5 ML SYRINGE
INTRAMUSCULAR | Status: DC | PRN
  Administered 2021-03-10: 60 mg via INTRAVENOUS

## 2021-03-10 SURGICAL SUPPLY — 29 items
BRUSH CYTOL CELLEBRITY 1.5X140 (MISCELLANEOUS) IMPLANT
CANISTER SUCT 3000ML PPV (MISCELLANEOUS) ×3 IMPLANT
CONT SPEC 4OZ CLIKSEAL STRL BL (MISCELLANEOUS) ×3 IMPLANT
COVER BACK TABLE 60X90IN (DRAPES) ×3 IMPLANT
COVER DOME SNAP 22 D (MISCELLANEOUS) ×3 IMPLANT
FORCEPS BIOP RJ4 1.8 (CUTTING FORCEPS) IMPLANT
GAUZE SPONGE 4X4 12PLY STRL (GAUZE/BANDAGES/DRESSINGS) ×3 IMPLANT
GLOVE BIO SURGEON STRL SZ7.5 (GLOVE) ×3 IMPLANT
GOWN STRL REUS W/ TWL LRG LVL3 (GOWN DISPOSABLE) ×2 IMPLANT
GOWN STRL REUS W/TWL LRG LVL3 (GOWN DISPOSABLE) ×3
KIT CLEAN ENDO COMPLIANCE (KITS) ×6 IMPLANT
KIT TURNOVER KIT B (KITS) ×3 IMPLANT
MARKER SKIN DUAL TIP RULER LAB (MISCELLANEOUS) ×3 IMPLANT
NEEDLE EBUS SONO TIP PENTAX (NEEDLE) ×3 IMPLANT
NS IRRIG 1000ML POUR BTL (IV SOLUTION) ×3 IMPLANT
OIL SILICONE PENTAX (PARTS (SERVICE/REPAIRS)) ×3 IMPLANT
PAD ARMBOARD 7.5X6 YLW CONV (MISCELLANEOUS) ×6 IMPLANT
SOL ANTI FOG 6CC (MISCELLANEOUS) ×2 IMPLANT
SOLUTION ANTI FOG 6CC (MISCELLANEOUS) ×1
SYR 20CC LL (SYRINGE) ×6 IMPLANT
SYR 20ML ECCENTRIC (SYRINGE) ×6 IMPLANT
SYR 50ML SLIP (SYRINGE) IMPLANT
SYR 5ML LUER SLIP (SYRINGE) ×3 IMPLANT
TOWEL OR 17X24 6PK STRL BLUE (TOWEL DISPOSABLE) ×3 IMPLANT
TRAP SPECIMEN MUCOUS 40CC (MISCELLANEOUS) IMPLANT
TUBE CONNECTING 20X1/4 (TUBING) ×6 IMPLANT
UNDERPAD 30X30 (UNDERPADS AND DIAPERS) ×3 IMPLANT
VALVE DISPOSABLE (MISCELLANEOUS) ×3 IMPLANT
WATER STERILE IRR 1000ML POUR (IV SOLUTION) ×3 IMPLANT

## 2021-03-10 NOTE — Anesthesia Postprocedure Evaluation (Signed)
Anesthesia Post Note  Patient: Candice Hansen  Procedure(s) Performed: VIDEO BRONCHOSCOPY WITH ENDOBRONCHIAL ULTRASOUND (Bilateral) FINE NEEDLE ASPIRATION (FNA) LINEAR     Patient location during evaluation: PACU Anesthesia Type: General Level of consciousness: awake and alert and oriented Pain management: pain level controlled Vital Signs Assessment: post-procedure vital signs reviewed and stable Respiratory status: spontaneous breathing, nonlabored ventilation and respiratory function stable Cardiovascular status: blood pressure returned to baseline and stable Postop Assessment: no apparent nausea or vomiting Anesthetic complications: no   No notable events documented.  Last Vitals:  Vitals:   03/10/21 1630 03/10/21 1640  BP: 107/63 106/63  Pulse: 74 69  Resp: 18 16  Temp:    SpO2: 95% 95%    Last Pain:  Vitals:   03/10/21 1640  TempSrc:   PainSc: 0-No pain                 Keyaan Lederman A.

## 2021-03-10 NOTE — Anesthesia Procedure Notes (Signed)
Procedure Name: Intubation Date/Time: 03/10/2021 2:47 PM Performed by: Janace Litten, CRNA Pre-anesthesia Checklist: Patient identified, Emergency Drugs available, Suction available and Patient being monitored Patient Re-evaluated:Patient Re-evaluated prior to induction Oxygen Delivery Method: Circle System Utilized Preoxygenation: Pre-oxygenation with 100% oxygen Induction Type: IV induction Ventilation: Mask ventilation without difficulty Laryngoscope Size: Mac and 3 Grade View: Grade I Tube type: Oral Tube size: 8.5 mm Number of attempts: 1 Airway Equipment and Method: Stylet and Oral airway Placement Confirmation: ETT inserted through vocal cords under direct vision, positive ETCO2 and breath sounds checked- equal and bilateral Secured at: 22 cm Tube secured with: Tape Dental Injury: Teeth and Oropharynx as per pre-operative assessment

## 2021-03-10 NOTE — Anesthesia Preprocedure Evaluation (Addendum)
Anesthesia Evaluation  Patient identified by MRN, date of birth, ID band Patient awake    Reviewed: Allergy & Precautions, NPO status , Patient's Chart, lab work & pertinent test results  Airway Mallampati: II  TM Distance: >3 FB Neck ROM: Full    Dental no notable dental hx. (+) Teeth Intact, Dental Advisory Given   Pulmonary neg pulmonary ROS,  PET 2022 IMPRESSION: 1. The spiculated lesion of concern in the left lower lobe has a maximum SUV of 2.3, suspicious for malignancy. This suspicion is further raised by the presence of hypermetabolic left hilar and subcarinal adenopathy with maximum SUV of 7.1 and 7.5, respectively. 2. Aortic Atherosclerosis (ICD10-I70.0). Mild left anterior descending coronary artery atherosclerosis. 3.  Prominent stool throughout the colon favors constipation. 4. Bilateral benign renal cysts in addition to a small rim calcified structure anteriorly in the right kidney upper pole which is probably a small complex cyst although technically too small to characterize.    Pulmonary exam normal breath sounds clear to auscultation       Cardiovascular Normal cardiovascular exam+ dysrhythmias (PVCs)  Rhythm:Regular Rate:Normal     Neuro/Psych negative neurological ROS  negative psych ROS   GI/Hepatic negative GI ROS, Neg liver ROS,   Endo/Other  negative endocrine ROS  Renal/GU negative Renal ROS  negative genitourinary   Musculoskeletal negative musculoskeletal ROS (+)   Abdominal   Peds  Hematology negative hematology ROS (+)   Anesthesia Other Findings   Reproductive/Obstetrics                           Anesthesia Physical Anesthesia Plan  ASA: 2  Anesthesia Plan: General   Post-op Pain Management:    Induction: Intravenous  PONV Risk Score and Plan: 3 and Midazolam, Dexamethasone and Ondansetron  Airway Management Planned: Oral ETT  Additional  Equipment:   Intra-op Plan:   Post-operative Plan: Extubation in OR  Informed Consent: I have reviewed the patients History and Physical, chart, labs and discussed the procedure including the risks, benefits and alternatives for the proposed anesthesia with the patient or authorized representative who has indicated his/her understanding and acceptance.     Dental advisory given  Plan Discussed with: CRNA  Anesthesia Plan Comments:         Anesthesia Quick Evaluation

## 2021-03-10 NOTE — Discharge Instructions (Addendum)
Flexible Bronchoscopy, Care After This sheet gives you information about how to care for yourself after your test. Your doctor may also give you more specific instructions. If you have problems or questions, contact your doctor. Follow these instructions at home: Eating and drinking Do not eat or drink anything (not even water) for 2 hours after your test, or until your numbing medicine (local anesthetic) wears off. When your numbness is gone and your cough and gag reflexes have come back, you may: Eat only soft foods. Slowly drink liquids. The day after the test, go back to your normal diet. Driving Do not drive for 24 hours if you were given a medicine to help you relax (sedative). Do not drive or use heavy machinery while taking prescription pain medicine. General instructions  Take over-the-counter and prescription medicines only as told by your doctor. Return to your normal activities as told. Ask what activities are safe for you. Do not use any products that have nicotine or tobacco in them. This includes cigarettes and e-cigarettes. If you need help quitting, ask your doctor. Keep all follow-up visits as told by your doctor. This is important. It is very important if you had a tissue sample (biopsy) taken. Get help right away if: You have shortness of breath that gets worse. You get light-headed. You feel like you are going to pass out (faint). You have chest pain. You cough up: More than a little blood. More blood than before. Summary Do not eat or drink anything (not even water) for 2 hours after your test, or until your numbing medicine wears off. Do not use cigarettes. Do not use e-cigarettes. Get help right away if you have chest pain.  This information is not intended to replace advice given to you by your health care provider. Make sure you discuss any questions you have with your health care provider. Document Released: 02/28/2009 Document Revised: 04/15/2017 Document  Reviewed: 05/21/2016 Elsevier Patient Education  2020 [employer].

## 2021-03-10 NOTE — Op Note (Signed)
Video Bronchoscopy with Endobronchial Ultrasound Procedure Note  Date of Operation: 03/10/2021  Pre-op Diagnosis: adenopathy   Post-op Diagnosis: adenopathy   Surgeon: Garner Nash, DO   Assistants: none   Anesthesia: General endotracheal anesthesia  Operation: Flexible video fiberoptic bronchoscopy with endobronchial ultrasound and biopsies.  Estimated Blood Loss: Minimal  Complications: None   Indications and History: Candice Hansen is a 55 y.o. female with adenopathy.  The risks, benefits, complications, treatment options and expected outcomes were discussed with the patient.  The possibilities of pneumothorax, pneumonia, reaction to medication, pulmonary aspiration, perforation of a viscus, bleeding, failure to diagnose a condition and creating a complication requiring transfusion or operation were discussed with the patient who freely signed the consent.    Description of Procedure: The patient was examined in the preoperative area and history and data from the preprocedure consultation were reviewed. It was deemed appropriate to proceed.  The patient was taken to [facility], identified as Candice Hansen and the procedure verified as Flexible Video Fiberoptic Bronchoscopy.  A Time Out was held and the above information confirmed. After being taken to the operating room general anesthesia was initiated and the patient  was orally intubated. The video fiberoptic bronchoscope was introduced via the endotracheal tube and a general inspection was performed which showed normal appearing airways no endobronchial lesions. The standard scope was then withdrawn and the endobronchial ultrasound was used to identify and characterize the peritracheal, hilar and bronchial lymph nodes. Inspection showed enlarge subcarinal and left hilar node. Using real-time ultrasound guidance Wang needle biopsies were take from Station 7 and station 10L nodes and were sent for cytology. The patient tolerated the  procedure well without apparent complications. There was no significant blood loss. The bronchoscope was withdrawn. Anesthesia was reversed and the patient was taken to the PACU for recovery.   Samples: 1. Wang needle biopsies from 7 node 2. Wang needle biopsies from 10L node  Plans:  The patient will be discharged from the PACU to home when recovered from anesthesia. We will review the cytology, pathology and microbiology results with the patient when they become available. Outpatient followup will be with Garner Nash, DO.   Garner Nash, DO [facility] 03/10/2021 4:31 PM

## 2021-03-10 NOTE — Transfer of Care (Signed)
Immediate Anesthesia Transfer of Care Note  Patient: Candice Hansen  Procedure(s) Performed: VIDEO BRONCHOSCOPY WITH ENDOBRONCHIAL ULTRASOUND (Bilateral) FINE NEEDLE ASPIRATION (FNA) LINEAR  Patient Location: PACU and Endoscopy Unit  Anesthesia Type:General  Level of Consciousness: drowsy, patient cooperative and responds to stimulation  Airway & Oxygen Therapy: Patient Spontanous Breathing  Post-op Assessment: Report given to RN and Post -op Vital signs reviewed and stable  Post vital signs: Reviewed and stable  Last Vitals:  Vitals Value Taken Time  BP    Temp    Pulse 78 03/10/21 1618  Resp 14 03/10/21 1618  SpO2 96 % 03/10/21 1618  Vitals shown include unvalidated device data.  Last Pain:  Vitals:   03/10/21 1244  TempSrc:   PainSc: 0-No pain         Complications: No notable events documented.

## 2021-03-10 NOTE — Interval H&P Note (Signed)
History and Physical Interval Note:  03/10/2021 2:35 PM  [place]  has presented today for surgery, with the diagnosis of adenopathy.  The various methods of treatment have been discussed with the patient and family. After consideration of risks, benefits and other options for treatment, the patient has consented to  Procedure(s): [place] (Bilateral) as a surgical intervention.  The patient's history has been reviewed, patient examined, no change in status, stable for surgery.  I have reviewed the patient's chart and labs.  Questions were answered to the patient's satisfaction.     Isabella

## 2021-03-12 ENCOUNTER — Ambulatory Visit: Payer: 59 | Admitting: Internal Medicine

## 2021-03-13 LAB — CYTOLOGY - NON PAP

## 2021-03-16 ENCOUNTER — Encounter: Payer: Self-pay | Admitting: Acute Care

## 2021-03-16 ENCOUNTER — Ambulatory Visit (INDEPENDENT_AMBULATORY_CARE_PROVIDER_SITE_OTHER): Payer: 59

## 2021-03-16 ENCOUNTER — Ambulatory Visit (INDEPENDENT_AMBULATORY_CARE_PROVIDER_SITE_OTHER): Payer: 59 | Admitting: Acute Care

## 2021-03-16 ENCOUNTER — Other Ambulatory Visit: Payer: Self-pay

## 2021-03-16 ENCOUNTER — Telehealth: Payer: Self-pay | Admitting: Acute Care

## 2021-03-16 ENCOUNTER — Telehealth: Payer: Self-pay | Admitting: Pulmonary Disease

## 2021-03-16 VITALS — BP 100/70 | HR 82 | Temp 97.9°F | Ht 65.0 in | Wt 111.2 lb

## 2021-03-16 DIAGNOSIS — R911 Solitary pulmonary nodule: Secondary | ICD-10-CM

## 2021-03-16 DIAGNOSIS — R059 Cough, unspecified: Secondary | ICD-10-CM

## 2021-03-16 NOTE — Progress Notes (Signed)
History of Present Illness Candice Hansen is a  55 year old female, non-smoker, family history of father with young age diagnosis of small cell lung cancer.  Presents today for evaluation of adenopathy within the chest, abnormal hypermetabolic subcarinal and hilar node as well as lung nodule on PET scan. She is followed by Dr. Valeta Harms, who did a Bronch  on 10/25.    03/16/2021 Pt. Presents today for follow up after bronch. She had adenopathy within the chest, abnormal hypermetabolic subcarinal and hilar node as well as lung nodule on PET scan.This was an incidental finding.  She states she has been doing well since the procedure on 03/10/2021. . She does have a sense of chest tightness at rest and if she is exercising her arms. She has noted a cough since the procedure that is worse when she is lying down. . She is using salt water rinses and Paul's cough drops. I have told her it is ok to use chloraseptic and OTC cough medication. We also discussed using PPI and pepcid for 2 weekd to see if this decreases her cough.  We discussed that the biopsy results from lymph nodes 7 and 10 resulted as no overtly malignant cells. We discussed that this is a non-definitive result, and that we need to discuss with Dr.Icard appropriate plans for follow up. Both the patient and her husband are very anxious about the possibility this finding is malignant, and want additional plans for further diagnosis as soon as possible. I can understand their concern, but will reach out to Dr. Valeta Harms in regard to plans for further work up of the abnormal adenopathy and lung nodule. She and her husband are both wanting to have follow up as soon as possible, as they want to be very proactive .   Test Results: Biopsies 03/10/2021>> Bronch/ EBUS with biopsy of 7 node and 10L node  FINAL MICROSCOPIC DIAGNOSIS:   A. LYMPH NODE, 7, FINE NEEDLE ASPIRATION:  - No overtly malignant cells identified  - Lymphocytes present  -  B. LYMPH NODE,  10L, FINE NEEDLE ASPIRATION:  - No overtly malignant cells identified  - Lymphocytes present   CBC Latest Ref Rng & Units 03/10/2021 07/21/2019 07/15/2013  WBC 4.0 - 10.5 K/uL - 4.0 4.7  Hemoglobin 12.0 - 15.0 g/dL 13.6 14.8 12.6  Hematocrit 36.0 - 46.0 % 40.0 42.4 36.5  Platelets 150 - 400 K/uL - 184 149(L)    BMP Latest Ref Rng & Units 03/10/2021 07/21/2019 12/31/2013  Glucose 70 - 99 mg/dL 86 98 83  BUN 6 - 20 mg/dL 12 16 16   Creatinine 0.44 - 1.00 mg/dL 0.80 0.86 0.8  Sodium 135 - 145 mmol/L 140 140 140  Potassium 3.5 - 5.1 mmol/L 4.4 3.9 3.8  Chloride 98 - 111 mmol/L 104 107 108  CO2 22 - 32 mmol/L - 23 25  Calcium 8.9 - 10.3 mg/dL - 9.5 8.9    BNP No results found for: BNP  ProBNP No results found for: PROBNP  PFT No results found for: FEV1PRE, FEV1POST, FVCPRE, FVCPOST, TLC, DLCOUNC, PREFEV1FVCRT, PSTFEV1FVCRT  NM PET Image Initial (PI) Skull Base To Thigh  Result Date: 03/02/2021 CLINICAL DATA:  Initial treatment strategy for lung nodules EXAM: NUCLEAR MEDICINE PET SKULL BASE TO THIGH TECHNIQUE: 5.5 mCi F-18 FDG was injected intravenously. Full-ring PET imaging was performed from the skull base to thigh after the radiotracer. CT data was obtained and used for attenuation correction and anatomic localization. Fasting blood glucose: 94 mg/dl COMPARISON:  Cardiac calcium CT 02/05/2021 FINDINGS: Mediastinal blood pool activity: SUV max 1.8 Liver activity: SUV max NA NECK: No significant abnormal hypermetabolic activity in this region. Incidental CT findings: None CHEST: The 1.1 by 0.7 cm lesion of concern in the left lower lobe on image 35 of series 8 has maximum SUV of 2.3. A left hilar lymph node has a maximum SUV of 7.1. A left eccentric subcarinal lymph node measuring about 0.8 cm in short axis on image 72 of series 4 has a maximum SUV of 7.5. Incidental CT findings: Minimal left anterior descending coronary artery atherosclerosis. ABDOMEN/PELVIS: No significant abnormal  hypermetabolic activity in this region. Incidental CT findings: Fluid density right renal lesions appear photopenic compatible with cysts. 0.5 cm rim calcified structure anteriorly in the right kidney upper pole on image 114 series 4, technically nonspecific although statistically likely to be a small complex cyst. 0.6 cm hyperdensity anteriorly in the left mid kidney on image 119 series 4, 70 Hounsfield units hence compatible with a small Bosniak category 2 complex cyst. Minimal abdominal aortic atherosclerotic calcification. Postoperative findings in the proximal right colon. Prominent stool throughout the colon favors constipation. SKELETON: No significant abnormal hypermetabolic activity in this region. Incidental CT findings: none IMPRESSION: 1. The spiculated lesion of concern in the left lower lobe has a maximum SUV of 2.3, suspicious for malignancy. This suspicion is further raised by the presence of hypermetabolic left hilar and subcarinal adenopathy with maximum SUV of 7.1 and 7.5, respectively. 2. Aortic Atherosclerosis (ICD10-I70.0). Mild left anterior descending coronary artery atherosclerosis. 3.  Prominent stool throughout the colon favors constipation. 4. Bilateral benign renal cysts in addition to a small rim calcified structure anteriorly in the right kidney upper pole which is probably a small complex cyst although technically too small to characterize. Electronically Signed   By: Van Clines M.D.   On: 03/02/2021 07:21     Past medical hx Past Medical History:  Diagnosis Date   Chest pain 2019   Heart murmur    "nothing to worry about"   Palpitation    Syncope 2019     Social History   Tobacco Use   Smoking status: Never   Smokeless tobacco: Never  Vaping Use   Vaping Use: Never used  Substance Use Topics   Alcohol use: Yes    Alcohol/week: 2.0 standard drinks    Types: 2 Glasses of wine per week    Comment: social   Drug use: Never    Ms.Mcconaughey reports that she  has never smoked. She has never used smokeless tobacco. She reports current alcohol use of about 2.0 standard drinks per week. She reports that she does not use drugs.  Tobacco Cessation: Never smoker   Past surgical hx, Family hx, Social hx all reviewed.  Current Outpatient Medications on File Prior to Visit  Medication Sig   acetaminophen (TYLENOL) 500 MG tablet Take 1,000 mg by mouth every 6 (six) hours as needed (for pain.).   calcium carbonate (TUMS - DOSED IN MG ELEMENTAL CALCIUM) 500 MG chewable tablet Chew 1-2 tablets by mouth 2 (two) times daily as needed for indigestion or heartburn.   ibuprofen (ADVIL) 200 MG tablet Take 400 mg by mouth every 8 (eight) hours as needed (pain.).   loratadine (CLARITIN) 10 MG tablet    rosuvastatin (CRESTOR) 10 MG tablet Take 10 mg by mouth in the morning.   No current facility-administered medications on file prior to visit.     Allergies  Allergen Reactions  Penicillins Rash    Did it involve swelling of the face/tongue/throat, SOB, or low BP? N Did it involve sudden or severe rash/hives, skin peeling, or any reaction on the inside of your mouth or nose? Y Did you need to seek medical attention at a hospital or doctor's office? N When did it last happen?    20 years ago   If all above answers are "NO", may proceed with cephalosporin use.    Review Of Systems:  Constitutional:   No  weight loss, night sweats,  Fevers, chills, fatigue, or  lassitude.  HEENT:   No headaches,  Difficulty swallowing,  Tooth/dental problems, or  Sore throat,                No sneezing, itching, ear ache, nasal congestion, post nasal drip,   CV:  No chest pain,  + chest tightness, No Orthopnea, PND, swelling in lower extremities, anasarca, dizziness, palpitations, syncope.   GI  No heartburn, indigestion, abdominal pain, nausea, vomiting, diarrhea, change in bowel habits, loss of appetite, bloody stools.   Resp: No shortness of breath with exertion or at  rest.  No excess mucus, no productive cough,  + non-productive cough,  No coughing up of blood.  No change in color of mucus.  No wheezing.  No chest wall deformity  Skin: no rash or lesions.  GU: no dysuria, change in color of urine, no urgency or frequency.  No flank pain, no hematuria   MS:  No joint pain or swelling.  No decreased range of motion.  No back pain.  Psych:  No change in mood or affect. No depression or anxiety.  No memory loss.   Vital Signs BP 100/70   Pulse 82   Temp 97.9 F (36.6 C)   Ht 5\' 5"  (1.651 m)   Wt 111 lb 3.2 oz (50.4 kg)   LMP 06/01/2019   SpO2 97%   BMI 18.50 kg/m    Physical Exam:  General- No distress,  A&Ox3, pleasant ENT: No sinus tenderness, TM clear, pale nasal mucosa, no oral exudate,no post nasal drip, no LAN Cardiac: S1, S2, regular rate and rhythm, no murmur Chest: No wheeze/ rales/ dullness; no accessory muscle use, no nasal flaring, no sternal retractions Abd.: Soft Non-tender, ND, BS +, Body mass index is 18.5 kg/m.  Ext: No clubbing cyanosis, edema Neuro:  normal strength, MAE x 4, A&O x 3 Skin: No rashes, warm and dry, no lesions Psych: normal mood and behavior   Assessment/Plan  Abnormal CT Chest , with non- definitive biopsy of 7 and 10 L lymph nodes ( No overtly malignant cells identified) Cough and chest tightness since procedure Plan CXR now  Ok to continue using salt water rinses , and Chloraseptic as needed. Consider OTC cough medication for cough as needed Repeat biopsy / EBUS and Navigational Bronch 03/23/2021 with Dr. Lamonte Sakai Dr. Valeta Harms to book endo suite, and schedule Super D CT Chest asap Referral to Thoracic Surgery for eval for surgical biopsy of lymphadenopathy   Addendum I have called the patient with the results of her CXR that was done today.  Negative for acute cardiopulmonary disease. Vague nodular density in the left mid lung may correspond to the findings on prior PET-CT/cardiac CT with no  available comparison by plain film. She has verbalized understanding  I spent 45 minutes dedicated to the care of this patient on the date of this encounter to include pre-visit review of records, face-to-face time with the  patient discussing conditions above, post visit ordering of testing, clinical documentation with the electronic health record, making appropriate referrals as documented, and communicating necessary information to the patient's healthcare team.   Magdalen Spatz, NP 03/16/2021  10:10 AM

## 2021-03-16 NOTE — Telephone Encounter (Signed)
PCCM:  I called and spoke with the patient regarding recent office visit. She was unable to speak with me at the moment and requested that I call back at a later time.   Garner Nash, DO [facility] 03/16/2021 3:51 PM

## 2021-03-16 NOTE — Patient Instructions (Signed)
It is good to see you today. I will call you once I have spoken with Dr. Valeta Harms about your biopsy results. We will do a CXR today to make sure everything looks ok in regard to your cough.  I will call you with the results. Ok to continue using salt water rinses , and Chloraseptic as needed. Consider using omeprazole 20 mg once daily, and famotidine 20 mg at bedtime to see if this helps with cough. Follow up based on conversation with Dr. Valeta Harms.  Please contact office for sooner follow up if symptoms do not improve or worsen or seek emergency care

## 2021-03-16 NOTE — Telephone Encounter (Signed)
I have called the patient . I have given her the option of repeat EBUS and Navigational bronch to re-sample her lymph nodes as well as navigate to the Left lung nodule for biopsy and referral to thoracic Surgery after repeat biopsy. .  She is in agreement with this plan . Plan is for 11/7 procedure with Dr. Lamonte Sakai.  Dr. Valeta Harms will schedule the procedure.  He is planning on calling her tonight to review her results with her and discuss this plan.

## 2021-03-18 ENCOUNTER — Ambulatory Visit: Payer: 59 | Admitting: Acute Care

## 2021-03-19 ENCOUNTER — Telehealth: Payer: Self-pay | Admitting: Acute Care

## 2021-03-19 NOTE — Telephone Encounter (Signed)
This is the one that Daneil Dan was wanting to CXL this morning they just needed a new order for CT with Contrast per Dr. Valeta Harms order has been placed but will need to be rescheduled

## 2021-03-19 NOTE — Addendum Note (Signed)
Addended by: Collier Salina on: 03/19/2021 10:02 AM   Modules accepted: Orders

## 2021-03-19 NOTE — Telephone Encounter (Signed)
Super D has been cancelled, new CT order has been placed.   Will forward to PCCs to schedule CT and then send message back to triage to schedule OV with BI or SG.

## 2021-03-20 ENCOUNTER — Ambulatory Visit (HOSPITAL_COMMUNITY)
Admission: RE | Admit: 2021-03-20 | Discharge: 2021-03-20 | Disposition: A | Discharge status: Home / Self Care | Payer: 59 | Source: Ambulatory Visit | Attending: Acute Care | Admitting: Acute Care

## 2021-03-20 ENCOUNTER — Encounter (HOSPITAL_COMMUNITY): Payer: Self-pay | Admitting: Emergency Medicine

## 2021-03-20 ENCOUNTER — Telehealth: Payer: Self-pay | Admitting: Acute Care

## 2021-03-20 ENCOUNTER — Other Ambulatory Visit: Payer: 59

## 2021-03-20 ENCOUNTER — Other Ambulatory Visit: Payer: Self-pay

## 2021-03-20 DIAGNOSIS — R911 Solitary pulmonary nodule: Secondary | ICD-10-CM | POA: Diagnosis not present

## 2021-03-20 NOTE — Telephone Encounter (Signed)
I have scheduled the Ct chest W for 04/13/21 pt is aware but she wants sooner and she states it should be a super D she has put a Pharmacist, community message in also

## 2021-03-20 NOTE — Telephone Encounter (Signed)
[facility] calling back regarding this pt and bronch she can be reached @ [phone].Hillery Hunter

## 2021-03-20 NOTE — Telephone Encounter (Signed)
Thank you very much 

## 2021-03-20 NOTE — Telephone Encounter (Signed)
Pt is having her Super D CT @ [facility] today at 4:30.  Bronch is scheduled for 11/7 @ 1:15, checking in by 10:15 to complete covid test prior to her bronch.   No PA req for 43539 (super d CT) [id number] fro m11/7/22-11/30/22 auth# [id number]  Pt has been made aware.

## 2021-03-20 NOTE — Telephone Encounter (Signed)
I called [facility] and warned them that we were trying to pull this together, so they know her name, etc

## 2021-03-20 NOTE — Telephone Encounter (Signed)
BI please advise. Thanks  [facility] I sent this to Eric Form.  I am ccing you so you know what I found out today after we spoke to you and cancelled the Friday CT.  "We received an additional review of my broncoscopy pathology report from [facility] today.  Their analysis includes some rare atypical cells in one of the lymph nodes and Dr. Posey Pronto now believes we should proceed with the CT Scan and 2nd Brochoscopy that I had originally wanted.  I hate that I cancelled the CT scan for Friday but it seems to be my bad luck to get critical information just after cancelling a procedure.  I know you are extremely busy but I would really appreciate any help rescheduling the CT / Bronch as quickly as possible.  I will keep the surgery consult unless it conflicts with one of those two procedures.  Dr. Posey Pronto should call again on Friday.  Please let me know if you have any questions."

## 2021-03-20 NOTE — Telephone Encounter (Signed)
Please cancel super D CT order that is scheduled for this week.  Please order a CT chest with contrast in 3 to 4 weeks from now.  Please have follow-up with me or Eric Form after CT with contrast complete.   I called and spoke with the patient.   Garner Nash, DO  [facility]  03/19/2021 9:06 AM     SG or BI please advise if this will need to be super D CT or just CT scan.   thanks

## 2021-03-20 NOTE — Telephone Encounter (Signed)
Reviewed.  It sounds like initially they wanted to hold off on procedure to repeat her CT scan at the end November.  Now they will want to get the procedure on 11/7 after all.  I am not sure that this can be logistically accomplished but I will send this note to the [facility] to see if it has been approved, if a super D CT can be done before Monday.  I should have room on my procedure schedule to do robotic navigational bronchoscopy plus EBUS on 11/7 if the whole thing can be arranged.  It may not be possible.  If so then either Dr. Valeta Harms or I can try to get a procedure done as soon as can be arranged.

## 2021-03-20 NOTE — Telephone Encounter (Signed)
Patient stated that she spoke with [facility] and was advised that there was a concerned with the pathology report. Atypical cells were found.  Patient is questioning if Super D should be ordered and if so can this be performed prior to 04/13/2021.  [facility] oncologist recommended CT ASAP, as 2nd bx is needed.    Dr. Valeta Harms and Judson Roch, please advise. Thanks

## 2021-03-22 DIAGNOSIS — E785 Hyperlipidemia, unspecified: Secondary | ICD-10-CM | POA: Insufficient documentation

## 2021-03-23 ENCOUNTER — Ambulatory Visit (HOSPITAL_COMMUNITY): Payer: 59 | Admitting: Anesthesiology

## 2021-03-23 ENCOUNTER — Ambulatory Visit (HOSPITAL_COMMUNITY)
Admission: RE | Admit: 2021-03-23 | Discharge: 2021-03-23 | Disposition: A | Discharge status: Home / Self Care | Payer: 59 | Source: Ambulatory Visit | Attending: Emergency Medicine | Admitting: Emergency Medicine

## 2021-03-23 ENCOUNTER — Encounter (HOSPITAL_COMMUNITY)
Admission: RE | Disposition: A | Discharge status: Home / Self Care | Payer: Self-pay | Source: Ambulatory Visit | Attending: Emergency Medicine

## 2021-03-23 ENCOUNTER — Ambulatory Visit (HOSPITAL_COMMUNITY): Payer: 59

## 2021-03-23 ENCOUNTER — Encounter (HOSPITAL_COMMUNITY): Payer: Self-pay | Admitting: Emergency Medicine

## 2021-03-23 ENCOUNTER — Other Ambulatory Visit: Payer: Self-pay

## 2021-03-23 ENCOUNTER — Telehealth: Payer: Self-pay | Admitting: Emergency Medicine

## 2021-03-23 DIAGNOSIS — R599 Enlarged lymph nodes, unspecified: Secondary | ICD-10-CM

## 2021-03-23 DIAGNOSIS — Z9889 Other specified postprocedural states: Secondary | ICD-10-CM

## 2021-03-23 DIAGNOSIS — Z419 Encounter for procedure for purposes other than remedying health state, unspecified: Secondary | ICD-10-CM

## 2021-03-23 DIAGNOSIS — Z20822 Contact with and (suspected) exposure to covid-19: Secondary | ICD-10-CM | POA: Diagnosis not present

## 2021-03-23 DIAGNOSIS — Z801 Family history of malignant neoplasm of trachea, bronchus and lung: Secondary | ICD-10-CM | POA: Diagnosis not present

## 2021-03-23 DIAGNOSIS — E785 Hyperlipidemia, unspecified: Secondary | ICD-10-CM | POA: Diagnosis not present

## 2021-03-23 DIAGNOSIS — R590 Localized enlarged lymph nodes: Secondary | ICD-10-CM | POA: Insufficient documentation

## 2021-03-23 DIAGNOSIS — R911 Solitary pulmonary nodule: Secondary | ICD-10-CM | POA: Diagnosis present

## 2021-03-23 HISTORY — PX: BRONCHIAL NEEDLE ASPIRATION BIOPSY: SHX5106

## 2021-03-23 HISTORY — PX: VIDEO BRONCHOSCOPY WITH ENDOBRONCHIAL ULTRASOUND: SHX6177

## 2021-03-23 HISTORY — PX: BRONCHIAL BRUSHINGS: SHX5108

## 2021-03-23 HISTORY — PX: BRONCHIAL WASHINGS: SHX5105

## 2021-03-23 HISTORY — DX: Essential (primary) hypertension: I10

## 2021-03-23 HISTORY — PX: VIDEO BRONCHOSCOPY WITH ENDOBRONCHIAL NAVIGATION: SHX6175

## 2021-03-23 HISTORY — PX: BRONCHIAL BIOPSY: SHX5109

## 2021-03-23 LAB — BASIC METABOLIC PANEL WITH GFR
Anion gap: 8 (ref 5–15)
BUN: 9 mg/dL (ref 6–20)
CO2: 25 mmol/L (ref 22–32)
Calcium: 9.7 mg/dL (ref 8.9–10.3)
Chloride: 105 mmol/L (ref 98–111)
Creatinine, Ser: 0.77 mg/dL (ref 0.44–1.00)
GFR, Estimated: >60 mL/min
Glucose, Bld: 83 mg/dL (ref 70–99)
Potassium: 4 mmol/L (ref 3.5–5.1)
Sodium: 138 mmol/L (ref 135–145)

## 2021-03-23 LAB — CBC
HCT: 41.6 % (ref 36.0–46.0)
Hemoglobin: 13.7 g/dL (ref 12.0–15.0)
MCH: 32.6 pg (ref 26.0–34.0)
MCHC: 32.9 g/dL (ref 30.0–36.0)
MCV: 99 fL (ref 80.0–100.0)
Platelets: 147 K/uL — ABNORMAL LOW (ref 150–400)
RBC: 4.2 MIL/uL (ref 3.87–5.11)
RDW: 12.8 % (ref 11.5–15.5)
WBC: 5.8 K/uL (ref 4.0–10.5)
nRBC: 0 % (ref 0.0–0.2)

## 2021-03-23 LAB — SARS CORONAVIRUS 2 BY RT PCR (HOSPITAL ORDER, PERFORMED IN ~~LOC~~ HOSPITAL LAB): SARS Coronavirus 2: NEGATIVE

## 2021-03-23 SURGERY — VIDEO BRONCHOSCOPY WITH ENDOBRONCHIAL NAVIGATION
Anesthesia: General

## 2021-03-23 MED ORDER — EPHEDRINE SULFATE-NACL 50-0.9 MG/10ML-% IV SOSY
PREFILLED_SYRINGE | INTRAVENOUS | Status: DC | PRN
  Administered 2021-03-23: 5 mg via INTRAVENOUS

## 2021-03-23 MED ORDER — OXYCODONE HCL 5 MG PO TABS: 5.0000 mg | ORAL_TABLET | Freq: Once | ORAL | Status: DC | PRN

## 2021-03-23 MED ORDER — FENTANYL CITRATE (PF) 250 MCG/5ML IJ SOLN
INTRAMUSCULAR | Status: DC | PRN
  Administered 2021-03-23 (×2): 50 ug via INTRAVENOUS

## 2021-03-23 MED ORDER — FENTANYL CITRATE (PF) 100 MCG/2ML IJ SOLN: 25.0000 ug | INTRAMUSCULAR | Status: DC | PRN

## 2021-03-23 MED ORDER — OXYCODONE HCL 5 MG/5ML PO SOLN: 5.0000 mg | Freq: Once | ORAL | Status: DC | PRN

## 2021-03-23 MED ORDER — ONDANSETRON HCL 4 MG/2ML IJ SOLN
INTRAMUSCULAR | Status: DC | PRN
  Administered 2021-03-23: 4 mg via INTRAVENOUS

## 2021-03-23 MED ORDER — PROPOFOL 10 MG/ML IV BOLUS
INTRAVENOUS | Status: DC | PRN
  Administered 2021-03-23: 150 mg via INTRAVENOUS

## 2021-03-23 MED ORDER — DEXAMETHASONE SODIUM PHOSPHATE 10 MG/ML IJ SOLN
INTRAMUSCULAR | Status: DC | PRN
  Administered 2021-03-23: 10 mg via INTRAVENOUS

## 2021-03-23 MED ORDER — ONDANSETRON HCL 4 MG/2ML IJ SOLN: 4.0000 mg | Freq: Four times a day (QID) | INTRAMUSCULAR | Status: DC | PRN

## 2021-03-23 MED ORDER — CHLORHEXIDINE GLUCONATE 0.12 % MT SOLN
OROMUCOSAL | Status: AC
  Filled 2021-03-23: qty 15

## 2021-03-23 MED ORDER — ROCURONIUM BROMIDE 10 MG/ML (PF) SYRINGE
PREFILLED_SYRINGE | INTRAVENOUS | Status: DC | PRN
  Administered 2021-03-23: 50 mg via INTRAVENOUS
  Administered 2021-03-23: 30 mg via INTRAVENOUS
  Administered 2021-03-23: 20 mg via INTRAVENOUS

## 2021-03-23 MED ORDER — SUGAMMADEX SODIUM 200 MG/2ML IV SOLN
INTRAVENOUS | Status: DC | PRN
  Administered 2021-03-23: 200 mg via INTRAVENOUS

## 2021-03-23 MED ORDER — LIDOCAINE 2% (20 MG/ML) 5 ML SYRINGE
INTRAMUSCULAR | Status: DC | PRN
  Administered 2021-03-23: 60 mg via INTRAVENOUS

## 2021-03-23 MED ORDER — LACTATED RINGERS IV SOLN: INTRAVENOUS | Status: DC

## 2021-03-23 MED ORDER — GLYCOPYRROLATE PF 0.2 MG/ML IJ SOSY
PREFILLED_SYRINGE | INTRAMUSCULAR | Status: DC | PRN
  Administered 2021-03-23: .2 mg via INTRAVENOUS

## 2021-03-23 MED ORDER — MIDAZOLAM HCL 2 MG/2ML IJ SOLN
INTRAMUSCULAR | Status: DC | PRN
  Administered 2021-03-23: 2 mg via INTRAVENOUS

## 2021-03-23 MED ORDER — PHENYLEPHRINE 40 MCG/ML (10ML) SYRINGE FOR IV PUSH (FOR BLOOD PRESSURE SUPPORT)
PREFILLED_SYRINGE | INTRAVENOUS | Status: DC | PRN
  Administered 2021-03-23 (×2): 80 ug via INTRAVENOUS

## 2021-03-23 NOTE — Anesthesia Preprocedure Evaluation (Addendum)
Anesthesia Evaluation  Patient identified by MRN, date of birth, ID band Patient awake    Reviewed: Allergy & Precautions, H&P , NPO status , Patient's Chart, lab work & pertinent test results  Airway Mallampati: I  TM Distance: >3 FB Neck ROM: Full    Dental no notable dental hx. (+) Teeth Intact, Dental Advisory Given   Pulmonary neg pulmonary ROS,    Pulmonary exam normal breath sounds clear to auscultation       Cardiovascular Normal cardiovascular exam Rhythm:Regular Rate:Normal  HLD   Neuro/Psych negative neurological ROS  negative psych ROS   GI/Hepatic negative GI ROS, Neg liver ROS,   Endo/Other  negative endocrine ROS  Renal/GU negative Renal ROS  negative genitourinary   Musculoskeletal negative musculoskeletal ROS (+)   Abdominal   Peds  Hematology negative hematology ROS (+)   Anesthesia Other Findings   Reproductive/Obstetrics                           Anesthesia Physical Anesthesia Plan  ASA: 2  Anesthesia Plan: General   Post-op Pain Management:    Induction: Intravenous  PONV Risk Score and Plan: 3 and Midazolam, Dexamethasone and Ondansetron  Airway Management Planned: Oral ETT  Additional Equipment:   Intra-op Plan:   Post-operative Plan: Extubation in OR  Informed Consent: I have reviewed the patients History and Physical, chart, labs and discussed the procedure including the risks, benefits and alternatives for the proposed anesthesia with the patient or authorized representative who has indicated his/her understanding and acceptance.     Dental advisory given  Plan Discussed with: CRNA  Anesthesia Plan Comments:         Anesthesia Quick Evaluation

## 2021-03-23 NOTE — Op Note (Signed)
Video Bronchoscopy with Robotic Assisted Bronchoscopic Navigation,  Endobronchial Ultrasound and biopsies Procedure Note  Date of Operation: 03/23/2021   Pre-op Diagnosis: Left lower lobe pulmonary nodule, mediastinal adenopathy  Post-op Diagnosis: Same  Surgeon: Baltazar Apo  Assistants: None  Anesthesia: General endotracheal anesthesia  Operation: Flexible video fiberoptic bronchoscopy with robotic assistance and biopsies.  Estimated Blood Loss: Minimal  Complications: None  Indications and History: Candice Hansen is a 55 y.o. female with history of cough.  She is never smoker but has a family history of lung cancer.  CT chest showed a small left lower lobe pulmonary nodule, mediastinal adenopathy.  PET scan confirmed hypermetabolic activity in the nodes and to some degree in the nodule as well.  Recommendation was made to achieve tissue diagnosis via navigational bronchoscopy, endobronchial ultrasound. The risks, benefits, complications, treatment options and expected outcomes were discussed with the patient.  The possibilities of pneumothorax, pneumonia, reaction to medication, pulmonary aspiration, perforation of a viscus, bleeding, failure to diagnose a condition and creating a complication requiring transfusion or operation were discussed with the patient who freely signed the consent.    Description of Procedure: The patient was seen in the Preoperative Area, was examined and was deemed appropriate to proceed.  The patient was taken to [facility] endoscopy room 3, identified as Feliberto Gottron and the procedure verified as Flexible Video Fiberoptic Bronchoscopy.  A Time Out was held and the above information confirmed.   Prior to the date of the procedure a high-resolution CT scan of the chest was performed. Utilizing ION software program a virtual tracheobronchial tree was generated to allow the creation of distinct navigation pathways to the patient's parenchymal abnormalities. After being taken  to the operating room general anesthesia was initiated and the patient  was orally intubated. The video fiberoptic bronchoscope was introduced via the endotracheal tube and a general inspection was performed which showed normal right and left lung anatomy, aspiration of the bilateral mainstems was completed to remove any remaining secretions. Robotic catheter inserted into patient's endotracheal tube.   Target #1 left lower lobe pulmonary nodule: The distinct navigation pathways prepared prior to this procedure were then utilized to navigate to patient's lesion identified on CT scan. The robotic catheter was secured into place and the vision probe was withdrawn.  Lesion location was approximated using fluoroscopy and radial endobronchial ultrasound for peripheral targeting.  Local registration and targeting was performed using [employer] three-dimensional imaging.  Under fluoroscopic guidance transbronchial needle brushings, transbronchial needle biopsies, and transbronchial forceps biopsies were performed to be sent for cytology and pathology. A bronchioalveolar lavage was performed in the left lower lobe and sent for cytology.  The robotic scope was then withdrawn and the endobronchial ultrasound was used to identify and characterize the peritracheal, hilar and bronchial lymph nodes. Inspection showed enlargement at station 7, station 10 L. Using real-time ultrasound guidance Wang needle biopsies were take from Station 7 and 10 L nodes and were sent for cytology. The patient tolerated the procedure well without apparent complications. There was no significant blood loss. The bronchoscope was withdrawn. Anesthesia was reversed and the patient was taken to the PACU for recovery.   At the end of the procedure a general airway inspection was performed and there was no evidence of active bleeding. The bronchoscope was removed.  The patient tolerated the procedure well. There was no significant blood loss and there  were no obvious complications. A post-procedural chest x-ray is pending.   Samples Target #1: 1.  Transbronchial needle brushings from left lower lobe pulmonary nodule 2. Transbronchial Wang needle biopsies from left lower lobe pulmonary nodule 3. Transbronchial forceps biopsies from left lower lobe pulmonary nodule 4. Bronchoalveolar lavage from left lower lobe   EBUS samples: 1. Wang needle biopsies from 7 node 2. Wang needle biopsies from 10 L node   Plans:  The patient will be discharged from the PACU to home when recovered from anesthesia and after chest x-ray is reviewed. We will review the cytology, pathology and microbiology results with the patient when they become available. Outpatient followup will be with Dr Valeta Harms or Dr Lamonte Sakai   Baltazar Apo, MD, PhD 03/23/2021, 4:33 PM [facility] [phone] or if no answer before 7:00PM call [phone] For any issues after 7:00PM please call eLink [phone]  .

## 2021-03-23 NOTE — Transfer of Care (Signed)
Immediate Anesthesia Transfer of Care Note  Patient: Candice Hansen  Procedure(s) Performed: ROBOTIC ASSISTED VIDEO BRONCHOSCOPY WITH ENDOBRONCHIAL NAVIGATION VIDEO BRONCHOSCOPY WITH ENDOBRONCHIAL ULTRASOUND BRONCHIAL BRUSHINGS BRONCHIAL BIOPSIES BRONCHIAL WASHINGS BRONCHIAL NEEDLE ASPIRATION BIOPSIES  Patient Location: PACU  Anesthesia Type:General  Level of Consciousness: awake, alert , oriented and patient cooperative  Airway & Oxygen Therapy: Patient Spontanous Breathing and Patient connected to face mask oxygen  Post-op Assessment: Report given to RN, Post -op Vital signs reviewed and stable and Patient moving all extremities  Post vital signs: Reviewed and stable  Last Vitals:  Vitals Value Taken Time  BP 108/62 03/23/21 1641  Temp    Pulse 78 03/23/21 1642  Resp 18 03/23/21 1642  SpO2 98 % 03/23/21 1642  Vitals shown include unvalidated device data.  Last Pain:  Vitals:   03/23/21 1100  TempSrc:   PainSc: 0-No pain      Patients Stated Pain Goal: 1 (63/84/53 6468)  Complications: No notable events documented.

## 2021-03-23 NOTE — Interval H&P Note (Signed)
History and Physical Interval Note:  03/23/2021 12:11 PM  Candice Hansen  has presented today for surgery, with the diagnosis of ADENOPATHY.  The various methods of treatment have been discussed with the patient and family. After consideration of risks, benefits and other options for treatment, the patient has consented to  Procedure(s): ROBOTIC ASSISTED VIDEO BRONCHOSCOPY WITH ENDOBRONCHIAL NAVIGATION (N/A) VIDEO BRONCHOSCOPY WITH ENDOBRONCHIAL ULTRASOUND (N/A) as a surgical intervention.  The patient's history has been reviewed, patient examined, no change in status, stable for surgery.  I have reviewed the patient's chart and labs.  Questions were answered to the patient's satisfaction.     Collene Gobble

## 2021-03-23 NOTE — Anesthesia Procedure Notes (Signed)
Procedure Name: Intubation Date/Time: 03/23/2021 2:23 PM Performed by: Myna Bright, CRNA Pre-anesthesia Checklist: Patient identified, Emergency Drugs available, Suction available and Patient being monitored Patient Re-evaluated:Patient Re-evaluated prior to induction Oxygen Delivery Method: Circle system utilized Preoxygenation: Pre-oxygenation with 100% oxygen Induction Type: IV induction Ventilation: Mask ventilation without difficulty Laryngoscope Size: Mac and 3 Grade View: Grade I Tube type: Oral Tube size: 8.5 mm Number of attempts: 1 Airway Equipment and Method: Stylet Placement Confirmation: ETT inserted through vocal cords under direct vision, positive ETCO2 and breath sounds checked- equal and bilateral Secured at: 22 cm Tube secured with: Tape Dental Injury: Teeth and Oropharynx as per pre-operative assessment

## 2021-03-23 NOTE — Telephone Encounter (Signed)
Called patient but she did not answer. Left message for her to call back.  

## 2021-03-23 NOTE — Telephone Encounter (Signed)
Called x 2 and no answer, no option to leave msg.

## 2021-03-23 NOTE — Discharge Instructions (Signed)
Flexible Bronchoscopy, Care After This sheet gives you information about how to care for yourself after your test. Your doctor may also give you more specific instructions. If you have problems or questions, contact your doctor. Follow these instructions at home: Eating and drinking Do not eat or drink anything (not even water) for 2 hours after your test, or until your numbing medicine (local anesthetic) wears off. When your numbness is gone and your cough and gag reflexes have come back, you may: Eat only soft foods. Slowly drink liquids. The day after the test, go back to your normal diet. Driving Do not drive for 24 hours if you were given a medicine to help you relax (sedative). Do not drive or use heavy machinery while taking prescription pain medicine. General instructions  Take over-the-counter and prescription medicines only as told by your doctor. Return to your normal activities as told. Ask what activities are safe for you. Do not use any products that have nicotine or tobacco in them. This includes cigarettes and e-cigarettes. If you need help quitting, ask your doctor. Keep all follow-up visits as told by your doctor. This is important. It is very important if you had a tissue sample (biopsy) taken. Get help right away if: You have shortness of breath that gets worse. You get light-headed. You feel like you are going to pass out (faint). You have chest pain. You cough up: More than a little blood. More blood than before. Summary Do not eat or drink anything (not even water) for 2 hours after your test, or until your numbing medicine wears off. Do not use cigarettes. Do not use e-cigarettes. Get help right away if you have chest pain.  Please call our office for any questions or concerns.  [phone].  This information is not intended to replace advice given to you by your health care provider. Make sure you discuss any questions you have with your health care  provider. Document Released: 02/28/2009 Document Revised: 04/15/2017 Document Reviewed: 05/21/2016 Elsevier Patient Education  2020 [employer].

## 2021-03-23 NOTE — Telephone Encounter (Signed)
31628 is CPT code or bronch  Called the num,ber provided for Kizzy and there was no answer and no option to leave msg

## 2021-03-24 ENCOUNTER — Encounter (HOSPITAL_COMMUNITY): Payer: Self-pay | Admitting: Emergency Medicine

## 2021-03-24 ENCOUNTER — Telehealth: Payer: Self-pay | Admitting: Pulmonary Disease

## 2021-03-24 NOTE — Telephone Encounter (Signed)
This was authorized on 03/06/21 - auth# [id number] from 03/06/21-04/06/21. LM on Kizzye's VM.

## 2021-03-25 ENCOUNTER — Other Ambulatory Visit: Payer: Self-pay | Admitting: Thoracic Surgery (Cardiothoracic Vascular Surgery)

## 2021-03-25 ENCOUNTER — Other Ambulatory Visit: Payer: Self-pay | Admitting: *Deleted

## 2021-03-25 DIAGNOSIS — R911 Solitary pulmonary nodule: Secondary | ICD-10-CM

## 2021-03-25 LAB — SARS CORONAVIRUS 2 (TAT 6-24 HRS): SARS Coronavirus 2: NEGATIVE

## 2021-03-25 NOTE — Anesthesia Postprocedure Evaluation (Signed)
Anesthesia Post Note  Patient: MIYUKI RZASA  Procedure(s) Performed: ROBOTIC ASSISTED VIDEO BRONCHOSCOPY WITH ENDOBRONCHIAL NAVIGATION VIDEO BRONCHOSCOPY WITH ENDOBRONCHIAL ULTRASOUND BRONCHIAL BRUSHINGS BRONCHIAL BIOPSIES BRONCHIAL WASHINGS BRONCHIAL NEEDLE ASPIRATION BIOPSIES     Patient location during evaluation: PACU Anesthesia Type: General Level of consciousness: awake and alert Pain management: pain level controlled Vital Signs Assessment: post-procedure vital signs reviewed and stable Respiratory status: spontaneous breathing, nonlabored ventilation, respiratory function stable and patient connected to nasal cannula oxygen Cardiovascular status: blood pressure returned to baseline and stable Postop Assessment: no apparent nausea or vomiting Anesthetic complications: no   No notable events documented.  Last Vitals:  Vitals:   03/23/21 1730 03/23/21 1740  BP:  99/61  Pulse:  79  Resp:  19  Temp:  36.6 C  SpO2: 99% 98%    Last Pain:  Vitals:   03/23/21 1740  TempSrc:   PainSc: 0-No pain                 Saraia Platner

## 2021-03-25 NOTE — Progress Notes (Signed)
[address]       [place] [address]             [phone]                    Candice Hansen [place] Medical Record #[id number] Date of Birth: Oct 06, 1965  Referring: Magdalen Spatz, NP Primary Care: Deland Pretty, MD Primary Cardiologist: None  Chief Complaint:    Chief Complaint  Patient presents with   Lung Lesion    Surgical consult, Chest CT 03/20/21, PET Scan 02/27/21, Bronch 03/10/21    History of Present Illness:    Candice Hansen 55 y.o. female presents for surgical evaluation centimeter left lower lobe pulmonary nodule, hilar and mediastinal lymphadenopathy which is PET avid on PET/CT.  She is a lifelong non-smoker but was exposed to secondhand smoke as a child.  She is an avid runner.  This nodule was found incidentally on coronary calcium score CT.  She denies any shortness of breath or chest pain.  Her weight is stable and she also denies any neurologic symptoms.       Zubrod Score: At the time of surgery this patient's most appropriate activity status/level should be described as: [x]     0    Normal activity, no symptoms []     1    Restricted in physical strenuous activity but ambulatory, able to do out light work []     2    Ambulatory and capable of self care, unable to do work activities, up and about               >50 % of waking hours                              []     3    Only limited self care, in bed greater than 50% of waking hours []     4    Completely disabled, no self care, confined to bed or chair []     5    Moribund   Past Medical History:  Diagnosis Date   Chest pain 2019   Heart murmur    "nothing to worry about"   Palpitation    Syncope 2015   cause undetermined    Past Surgical History:  Procedure Laterality Date   BRONCHIAL BIOPSY  03/23/2021   Procedure: BRONCHIAL BIOPSIES;  Surgeon: Collene Gobble, MD;  Location: [facility];  Service: Pulmonary;;   BRONCHIAL BRUSHINGS  03/23/2021   Procedure: BRONCHIAL  BRUSHINGS;  Surgeon: Collene Gobble, MD;  Location: [facility];  Service: Pulmonary;;   BRONCHIAL NEEDLE ASPIRATION BIOPSY  03/23/2021   Procedure: BRONCHIAL NEEDLE ASPIRATION BIOPSIES;  Surgeon: Collene Gobble, MD;  Location: [facility];  Service: Pulmonary;;   BRONCHIAL WASHINGS  03/23/2021   Procedure: BRONCHIAL WASHINGS;  Surgeon: Collene Gobble, MD;  Location: [facility];  Service: Pulmonary;;   COLON RESECTION  2019   for twisted secum   FINE NEEDLE ASPIRATION  03/10/2021   Procedure: FINE NEEDLE ASPIRATION (FNA) LINEAR;  Surgeon: Garner Nash, DO;  Location: [facility];  Service: Pulmonary;;   OVARIAN CYST REMOVAL     VIDEO BRONCHOSCOPY WITH ENDOBRONCHIAL NAVIGATION N/A 03/23/2021   Procedure: ROBOTIC ASSISTED VIDEO BRONCHOSCOPY WITH ENDOBRONCHIAL NAVIGATION;  Surgeon: Collene Gobble, MD;  Location: [facility];  Service: Pulmonary;  Laterality: N/A;   VIDEO BRONCHOSCOPY WITH  ENDOBRONCHIAL ULTRASOUND Bilateral 03/10/2021   Procedure: VIDEO BRONCHOSCOPY WITH ENDOBRONCHIAL ULTRASOUND;  Surgeon: Garner Nash, DO;  Location: [facility];  Service: Pulmonary;  Laterality: Bilateral;   VIDEO BRONCHOSCOPY WITH ENDOBRONCHIAL ULTRASOUND N/A 03/23/2021   Procedure: VIDEO BRONCHOSCOPY WITH ENDOBRONCHIAL ULTRASOUND;  Surgeon: Collene Gobble, MD;  Location: [facility];  Service: Pulmonary;  Laterality: N/A;    Family History  Problem Relation Age of Onset   Lung cancer Father      Social History   Tobacco Use  Smoking Status Never  Smokeless Tobacco Never    Social History   Substance and Sexual Activity  Alcohol Use Yes   Alcohol/week: 2.0 standard drinks   Types: 2 Glasses of wine per week   Comment: social     Allergies  Allergen Reactions   Penicillins Rash    Did it involve swelling of the face/tongue/throat, SOB, or low BP? N Did it involve sudden or severe rash/hives, skin peeling, or any reaction on the inside of your mouth or nose? Y Did you need  to seek medical attention at a hospital or doctor's office? N When did it last happen?    20 years ago   If all above answers are "NO", may proceed with cephalosporin use.    Current Outpatient Medications  Medication Sig Dispense Refill   acetaminophen (TYLENOL) 500 MG tablet Take 1,000 mg by mouth every 6 (six) hours as needed (for pain.).     calcium carbonate (TUMS - DOSED IN MG ELEMENTAL CALCIUM) 500 MG chewable tablet Chew 1-2 tablets by mouth 2 (two) times daily as needed for indigestion or heartburn.     ibuprofen (ADVIL) 200 MG tablet Take 400 mg by mouth every 8 (eight) hours as needed (pain.).     loratadine (CLARITIN) 10 MG tablet      rosuvastatin (CRESTOR) 10 MG tablet Take 10 mg by mouth in the morning.     No current facility-administered medications for this visit.    Review of Systems  Constitutional: Negative.   Respiratory: Negative.    Cardiovascular: Negative.   Neurological: Negative.     PHYSICAL EXAMINATION: BP 110/75   Pulse 85   Resp 20   Ht 5\' 5"  (1.651 m)   Wt 106 lb (48.1 kg)   LMP 06/01/2019   SpO2 97% Comment: RA  BMI 17.64 kg/m  Physical Exam Constitutional:      General: She is not in acute distress.    Appearance: Normal appearance. She is normal weight. She is not ill-appearing.  Eyes:     Extraocular Movements: Extraocular movements intact.  Cardiovascular:     Rate and Rhythm: Normal rate.  Pulmonary:     Effort: Pulmonary effort is normal. No respiratory distress.  Musculoskeletal:        General: Normal range of motion.     Cervical back: Normal range of motion.  Neurological:     General: No focal deficit present.     Mental Status: She is alert and oriented to person, place, and time.    Diagnostic Studies & Laboratory data:     Recent Radiology Findings:   DG Chest 2 View  Result Date: 03/16/2021 CLINICAL DATA:  55 year old female with cough EXAM: CHEST - 2 VIEW COMPARISON:  07/15/2013, PET-CT 02/27/2021, cardiac CT  02/05/2021 No displaced fracture FINDINGS: Cardiomediastinal silhouette within normal limits in size and contour. No pneumothorax, pleural effusion, or confluent airspace disease. No interlobular septal thickening. No central vascular congestion. Vague nodularity in  the left mid lung, may correspond to the nodular change which was present on PET CT and cardiac CT, with no prior plain film comparison available. IMPRESSION: Negative for acute cardiopulmonary disease. Vague nodular density in the left mid lung may correspond to the findings on prior PET-CT/cardiac CT with no available comparison by plain film. Electronically Signed   By: Corrie Mckusick D.O.   On: 03/16/2021 11:52   NM PET Image Initial (PI) Skull Base To Thigh  Result Date: 03/02/2021 CLINICAL DATA:  Initial treatment strategy for lung nodules EXAM: NUCLEAR MEDICINE PET SKULL BASE TO THIGH TECHNIQUE: 5.5 mCi F-18 FDG was injected intravenously. Full-ring PET imaging was performed from the skull base to thigh after the radiotracer. CT data was obtained and used for attenuation correction and anatomic localization. Fasting blood glucose: 94 mg/dl COMPARISON:  Cardiac calcium CT 02/05/2021 FINDINGS: Mediastinal blood pool activity: SUV max 1.8 Liver activity: SUV max NA NECK: No significant abnormal hypermetabolic activity in this region. Incidental CT findings: None CHEST: The 1.1 by 0.7 cm lesion of concern in the left lower lobe on image 35 of series 8 has maximum SUV of 2.3. A left hilar lymph node has a maximum SUV of 7.1. A left eccentric subcarinal lymph node measuring about 0.8 cm in short axis on image 72 of series 4 has a maximum SUV of 7.5. Incidental CT findings: Minimal left anterior descending coronary artery atherosclerosis. ABDOMEN/PELVIS: No significant abnormal hypermetabolic activity in this region. Incidental CT findings: Fluid density right renal lesions appear photopenic compatible with cysts. 0.5 cm rim calcified structure  anteriorly in the right kidney upper pole on image 114 series 4, technically nonspecific although statistically likely to be a small complex cyst. 0.6 cm hyperdensity anteriorly in the left mid kidney on image 119 series 4, 70 Hounsfield units hence compatible with a small Bosniak category 2 complex cyst. Minimal abdominal aortic atherosclerotic calcification. Postoperative findings in the proximal right colon. Prominent stool throughout the colon favors constipation. SKELETON: No significant abnormal hypermetabolic activity in this region. Incidental CT findings: none IMPRESSION: 1. The spiculated lesion of concern in the left lower lobe has a maximum SUV of 2.3, suspicious for malignancy. This suspicion is further raised by the presence of hypermetabolic left hilar and subcarinal adenopathy with maximum SUV of 7.1 and 7.5, respectively. 2. Aortic Atherosclerosis (ICD10-I70.0). Mild left anterior descending coronary artery atherosclerosis. 3.  Prominent stool throughout the colon favors constipation. 4. Bilateral benign renal cysts in addition to a small rim calcified structure anteriorly in the right kidney upper pole which is probably a small complex cyst although technically too small to characterize. Electronically Signed   By: Van Clines M.D.   On: 03/02/2021 07:21   DG Chest Port 1 View  Result Date: 03/23/2021 CLINICAL DATA:  Post bronchoscopy with bi biopsy EXAM: PORTABLE CHEST 1 VIEW COMPARISON:  Portable exam 1656 hours compared to 03/16/2021 FINDINGS: Normal heart size, mediastinal contours, and pulmonary vascularity. Lungs hyperinflated with an ill-defined area of opacity in the lower LEFT lung, likely representing mild edema or hemorrhage surrounding nodular focus seen on prior radiographs. No pleural effusion or pneumothorax. Osseous structures unremarkable. IMPRESSION: Mild edema or hemorrhage surrounding the nodular focus seen in the lower LEFT chest on prior exam. No pleural effusion or  pneumothorax identified. Electronically Signed   By: Lavonia Dana M.D.   On: 03/23/2021 17:02   DG C-Arm 1-60 Min-No Report  Result Date: 03/23/2021 Fluoroscopy was utilized by the requesting physician.  No  radiographic interpretation.   CT Super D Chest Wo Contrast  Result Date: 03/20/2021 CLINICAL DATA:  Left lung nodule. EXAM: CT CHEST WITHOUT CONTRAST TECHNIQUE: Multidetector CT imaging of the chest was performed using thin slice collimation for electromagnetic bronchoscopy planning purposes, without intravenous contrast. COMPARISON:  PET-CT on 02/27/2021 FINDINGS: Cardiovascular:  No acute findings. Mediastinum/Nodes: No masses or pathologically enlarged lymph nodes identified on this unenhanced exam. Lungs/Pleura: Persistent spiculated pulmonary nodule is seen in the left lower lobe measuring 1.3 x 0.7 cm, without significant change since previous study. No other suspicious pulmonary nodules are identified. No evidence of pulmonary infiltrate or pleural effusion. Upper Abdomen:  Unremarkable. Musculoskeletal:  No suspicious bone lesions. IMPRESSION: Persistent 1.3 cm spiculated left lower lobe pulmonary nodule, highly suspicious for primary bronchogenic carcinoma. No pathologically enlarged lymph nodes or pleural effusion. Electronically Signed   By: Marlaine Hind M.D.   On: 03/20/2021 17:21   DG C-ARM BRONCHOSCOPY  Result Date: 03/23/2021 C-ARM BRONCHOSCOPY: Fluoroscopy was utilized by the requesting physician.  No radiographic interpretation.       I have independently reviewed the above radiology studies  and reviewed the findings with the patient.   Recent Lab Findings: Lab Results  Component Value Date   WBC 5.8 03/23/2021   HGB 13.7 03/23/2021   HCT 41.6 03/23/2021   PLT 147 (L) 03/23/2021   GLUCOSE 83 03/23/2021   CHOL 178 12/31/2013   TRIG 40.0 12/31/2013   HDL 55.60 12/31/2013   LDLCALC 114 (H) 12/31/2013   ALT 15 07/21/2019   AST 19 07/21/2019   NA 138 03/23/2021   K  4.0 03/23/2021   CL 105 03/23/2021   CREATININE 0.77 03/23/2021   BUN 9 03/23/2021   CO2 25 03/23/2021   TSH 2.16 12/30/2011     PFTs: - FVC: 105% - FEV1: 113% -DLCO: 103%  Problem List: 1.3 cm left lower lobe pulmonary nodule Hilar and mediastinal avidity.  Assessment / Plan:   This is a 55 year old female with 1.3 cm left lower lobe pulmonary nodule, and hilar and mediastinal avidity.  She is undergone navigational bronchoscopy with EBUS.  Biopsy results have all shown no malignancy.  The brushings did show some atypical cells but these were nondiagnostic.  She is a lifelong smoker and has very low risk factors for this being a primary lung cancer.  Given the fact that she has had 2 separate biopsies by 2 excellent interventional pulmonologist I think that there is a very low likelihood that this is a primary malignancy.  I do think that she should undergo reimaging after the holidays.  If these persist then we can discuss resection of the pulmonary nodule as well as biopsy of the lymph nodes.  She likely will require another navigational bronchoscopy with marking if she is to undergo wedge resection.     I  spent 40 minutes with  the patient face to face in counseling and coordination of care.    Candice Hansen 03/27/2021 4:38 PM

## 2021-03-25 NOTE — Progress Notes (Unsigned)
p 

## 2021-03-27 ENCOUNTER — Other Ambulatory Visit: Payer: Self-pay

## 2021-03-27 ENCOUNTER — Ambulatory Visit (HOSPITAL_COMMUNITY)
Admission: RE | Admit: 2021-03-27 | Discharge: 2021-03-27 | Disposition: A | Discharge status: Home / Self Care | Payer: 59 | Source: Ambulatory Visit | Attending: Thoracic Surgery (Cardiothoracic Vascular Surgery) | Admitting: Thoracic Surgery (Cardiothoracic Vascular Surgery)

## 2021-03-27 ENCOUNTER — Institutional Professional Consult (permissible substitution) (INDEPENDENT_AMBULATORY_CARE_PROVIDER_SITE_OTHER): Payer: 59 | Admitting: Thoracic Surgery (Cardiothoracic Vascular Surgery)

## 2021-03-27 VITALS — BP 110/75 | HR 85 | Resp 20 | Ht 65.0 in | Wt 106.0 lb

## 2021-03-27 DIAGNOSIS — R911 Solitary pulmonary nodule: Secondary | ICD-10-CM | POA: Insufficient documentation

## 2021-03-27 DIAGNOSIS — F411 Generalized anxiety disorder: Secondary | ICD-10-CM | POA: Insufficient documentation

## 2021-03-27 DIAGNOSIS — Z9049 Acquired absence of other specified parts of digestive tract: Secondary | ICD-10-CM | POA: Insufficient documentation

## 2021-03-27 DIAGNOSIS — J302 Other seasonal allergic rhinitis: Secondary | ICD-10-CM | POA: Insufficient documentation

## 2021-03-27 DIAGNOSIS — I2510 Atherosclerotic heart disease of native coronary artery without angina pectoris: Secondary | ICD-10-CM | POA: Insufficient documentation

## 2021-03-27 DIAGNOSIS — Z880 Allergy status to penicillin: Secondary | ICD-10-CM | POA: Insufficient documentation

## 2021-03-27 DIAGNOSIS — L853 Xerosis cutis: Secondary | ICD-10-CM | POA: Insufficient documentation

## 2021-03-27 LAB — PULMONARY FUNCTION TEST
DL/VA % pred: 104 %
DL/VA: 4.44 ml/min/mmHg/L
DLCO cor % pred: 103 %
DLCO cor: 22.25 ml/min/mmHg
DLCO unc % pred: 104 %
DLCO unc: 22.45 ml/min/mmHg
FEF 25-75 Pre: 4.06 L/sec
FEF2575-%Pred-Pre: 150 %
FEV1-%Pred-Pre: 113 %
FEV1-Pre: 3.21 L
FEV1FVC-%Pred-Pre: 106 %
FEV6-%Pred-Pre: 108 %
FEV6-Pre: 3.8 L
FEV6FVC-%Pred-Pre: 102 %
FVC-%Pred-Pre: 105 %
FVC-Pre: 3.8 L
Pre FEV1/FVC ratio: 85 %
Pre FEV6/FVC Ratio: 100 %
RV % pred: 103 %
RV: 1.97 L
TLC % pred: 113 %
TLC: 5.89 L

## 2021-03-27 LAB — CYTOLOGY - NON PAP

## 2021-03-27 MED ORDER — ALBUTEROL SULFATE (2.5 MG/3ML) 0.083% IN NEBU
2.5000 mg | INHALATION_SOLUTION | Freq: Once | RESPIRATORY_TRACT | Status: DC
Start: 2021-03-27 — End: 2021-03-27

## 2021-03-31 ENCOUNTER — Telehealth: Payer: Self-pay

## 2021-03-31 NOTE — Telephone Encounter (Signed)
Call made to patient, confirmed DOB. Appt made.   Nothing further needed at this time.  

## 2021-03-31 NOTE — Telephone Encounter (Signed)
-----   Message from Garner Nash, DO sent at 03/27/2021  5:18 PM EST -----  Triage,   Lets make sure she has a follow up appt with me after her ct chest in Jan   Thanks  BLI    ----- Message ----- From: Ned Clines Sent: 03/27/2021   1:49 PM EST To: Garner Nash, DO  I did see CT scan scheduled but not an appointment. thanks ----- Message ----- From: Garner Nash, DO Sent: 03/27/2021   1:48 PM EST To: Ned Clines   She is my clinic patient. She should have follow up already scheduled I believe   Thanks  BLI    ----- Message ----- From: Ned Clines Sent: 03/27/2021   1:40 PM EST To: Garner Nash, DO  Dr Kipp Brood would like this patient to have appointment with you- pulmonary nodule. Thanks  Jenny Reichmann ----- Message ----- From: Laury Deep, RN Sent: 03/25/2021  12:29 PM EST To: Baxter Hire, MD  Oh yayy!! I was worried it would be way too hard being only 2 days away.  TY so much [place]!  Ryan ----- Message ----- From: Ned Clines Sent: 03/25/2021  12:16 PM EST To: Laury Deep, RN, Lajuana Matte, MD  Set up for Friday at 11am at [facility] For PFT's. Jenny Reichmann ----- Message ----- From: Laury Deep, RN Sent: 03/25/2021  10:16 AM EST To: Ned Clines  HL said this patient needs FULL PFTS before appt Friday (ugh).  Only option is to get COVID swab tomorrow and PFTS on Friday morning??  If not, we might have to move her or leave her scheduled but get PFTS asap.    Thanks, [employer]

## 2021-04-07 NOTE — Telephone Encounter (Signed)
Pt had an OV with Dr. Kipp Brood 11/11 and CT results were discussed with pt at that [place]. Nothing further needed.

## 2021-04-13 ENCOUNTER — Inpatient Hospital Stay: Admission: RE | Admit: 2021-04-13 | Payer: 59 | Source: Ambulatory Visit

## 2021-04-17 ENCOUNTER — Ambulatory Visit: Payer: 59 | Admitting: Pulmonary Disease

## 2021-04-30 ENCOUNTER — Other Ambulatory Visit: Payer: Self-pay | Admitting: Hematology and Oncology

## 2021-04-30 DIAGNOSIS — C801 Malignant (primary) neoplasm, unspecified: Secondary | ICD-10-CM

## 2021-05-13 ENCOUNTER — Ambulatory Visit
Admission: RE | Admit: 2021-05-13 | Discharge: 2021-05-13 | Disposition: A | Discharge status: Home / Self Care | Payer: 59 | Source: Ambulatory Visit | Attending: Hematology and Oncology | Admitting: Hematology and Oncology

## 2021-05-13 ENCOUNTER — Ambulatory Visit: Payer: 59 | Admitting: Pulmonary Disease

## 2021-05-13 DIAGNOSIS — C801 Malignant (primary) neoplasm, unspecified: Secondary | ICD-10-CM | POA: Diagnosis present

## 2021-05-13 MED ORDER — GADOBUTROL 1 MMOL/ML IV SOLN
5.0000 mL | Freq: Once | INTRAVENOUS | Status: AC | PRN
  Administered 2021-05-13: 11:00:00 5 mL via INTRAVENOUS

## 2021-05-15 ENCOUNTER — Ambulatory Visit: Payer: 59

## 2021-12-28 ENCOUNTER — Other Ambulatory Visit: Payer: Self-pay | Admitting: Obstetrics and Gynecology

## 2021-12-28 DIAGNOSIS — Z1231 Encounter for screening mammogram for malignant neoplasm of breast: Secondary | ICD-10-CM

## 2022-01-12 ENCOUNTER — Ambulatory Visit
Admission: RE | Admit: 2022-01-12 | Discharge: 2022-01-12 | Disposition: A | Discharge status: Home / Self Care | Payer: 59 | Source: Ambulatory Visit | Attending: Obstetrics and Gynecology | Admitting: Obstetrics and Gynecology

## 2022-01-12 DIAGNOSIS — Z1231 Encounter for screening mammogram for malignant neoplasm of breast: Secondary | ICD-10-CM

## 2022-12-28 ENCOUNTER — Other Ambulatory Visit: Payer: Self-pay | Admitting: Obstetrics and Gynecology

## 2022-12-28 DIAGNOSIS — Z1231 Encounter for screening mammogram for malignant neoplasm of breast: Secondary | ICD-10-CM

## 2023-01-04 LAB — HM DEXA SCAN

## 2023-01-19 ENCOUNTER — Ambulatory Visit
Admission: RE | Admit: 2023-01-19 | Discharge: 2023-01-19 | Disposition: A | Discharge status: Home / Self Care | Payer: 59 | Source: Ambulatory Visit | Attending: Obstetrics and Gynecology | Admitting: Obstetrics and Gynecology

## 2023-01-19 DIAGNOSIS — Z1231 Encounter for screening mammogram for malignant neoplasm of breast: Secondary | ICD-10-CM

## 2023-02-07 ENCOUNTER — Ambulatory Visit: Payer: 59 | Admitting: Family Medicine

## 2023-02-16 ENCOUNTER — Ambulatory Visit (INDEPENDENT_AMBULATORY_CARE_PROVIDER_SITE_OTHER): Payer: 59 | Admitting: Sports Medicine

## 2023-02-16 ENCOUNTER — Telehealth: Payer: Self-pay | Admitting: *Deleted

## 2023-02-16 VITALS — BP 100/70 | Ht 65.75 in | Wt 104.0 lb

## 2023-02-16 DIAGNOSIS — M818 Other osteoporosis without current pathological fracture: Secondary | ICD-10-CM | POA: Diagnosis not present

## 2023-02-16 DIAGNOSIS — M810 Age-related osteoporosis without current pathological fracture: Secondary | ICD-10-CM | POA: Insufficient documentation

## 2023-02-16 NOTE — Progress Notes (Signed)
Chief complaint follow-up of possible osteoporosis  Patient had bone density screening at her OB/GYN office Her spine levels were T-scores of -3.5 Hips were -2.3 and -2.1  She has been doing well with treatment for non-small cell carcinoma of the lung He has the EGFR mutation at sites 19 and 21  Her treatment with cisplatin and pemetrexed Currently she has been maintained on Tagrisso 80 mg She did not have radiation  Her history regarding bone is that she was a distance runner for many years but she did not have problems with eating disorder and never had a history of stress fractures However she has always had a thin frame  Currently no significant joint or bone related pain  Exam reveals a pleasant female in no acute distress She is alert oriented and conversant in her disease BP 100/70   Ht 5' 5.75" (1.67 m)   Wt 104 lb (47.2 kg)   LMP 05/17/2018   BMI 16.91 kg/m

## 2023-02-16 NOTE — Assessment & Plan Note (Signed)
Based on her bone density levels and her risk profile she would probably benefit by not being on an anabolic treatment  With her history of cancer Evenity may be the best choice  We discussed that option with her and she will consider whether she would like to go on a treatment protocol with Evenity with 1 shot per mouth for the next 12 months following her bone densities.

## 2023-02-16 NOTE — Telephone Encounter (Signed)
Evenity VOB initiated via [email or web address]

## 2023-02-18 NOTE — Telephone Encounter (Signed)
Ready for Evenity scheduling on or after: 02/18/23  Evenity and administration are covered at 100%.  PA is approved: 02/18/23 to 02/18/24  PA #: [id number].

## 2023-02-21 ENCOUNTER — Other Ambulatory Visit: Payer: Self-pay | Admitting: *Deleted

## 2023-02-23 ENCOUNTER — Ambulatory Visit: Payer: 59 | Admitting: Family Medicine

## 2023-02-23 DIAGNOSIS — M818 Other osteoporosis without current pathological fracture: Secondary | ICD-10-CM | POA: Diagnosis not present

## 2023-02-23 DIAGNOSIS — M810 Age-related osteoporosis without current pathological fracture: Secondary | ICD-10-CM

## 2023-02-23 MED ORDER — ROMOSOZUMAB-AQQG 105 MG/1.17ML ~~LOC~~ SOSY
210.0000 mg | PREFILLED_SYRINGE | Freq: Once | SUBCUTANEOUS | Status: AC
Start: 2023-02-23 — End: 2023-02-23
  Administered 2023-02-23: 210 mg via SUBCUTANEOUS

## 2023-02-23 NOTE — Progress Notes (Signed)
Patient is here for evenity injection #1. Patient received bilateral lower abdomen [place] evenity injections today. She waited 15 mins for a medication reaction. She tolerated injections well. She will return in 1 month for her next evenity injection.

## 2023-03-12 MED ORDER — ROMOSOZUMAB-AQQG 105 MG/1.17ML ~~LOC~~ SOSY
210.0000 mg | PREFILLED_SYRINGE | Freq: Once | SUBCUTANEOUS | Status: AC
  Administered 2023-05-31: 210 mg via SUBCUTANEOUS

## 2023-03-12 NOTE — Addendum Note (Signed)
Addended by: Dierdre Searles on: 03/12/2023 06:01 PM   Modules accepted: Orders

## 2023-03-28 ENCOUNTER — Ambulatory Visit (INDEPENDENT_AMBULATORY_CARE_PROVIDER_SITE_OTHER): Payer: 59 | Admitting: Family Medicine

## 2023-03-28 DIAGNOSIS — M810 Age-related osteoporosis without current pathological fracture: Secondary | ICD-10-CM | POA: Diagnosis not present

## 2023-03-28 MED ORDER — ROMOSOZUMAB-AQQG 105 MG/1.17ML ~~LOC~~ SOSY
210.0000 mg | PREFILLED_SYRINGE | Freq: Once | SUBCUTANEOUS | Status: AC
  Administered 2023-03-28: 210 mg via SUBCUTANEOUS

## 2023-03-28 NOTE — Progress Notes (Signed)
Patient is here for evenity injection #2. Patient received bilateral lower abdomen [place] evenity injections today. She tolerated injections well. She will return in 1 month for her next evenity injection.

## 2023-03-29 NOTE — Telephone Encounter (Addendum)
Evenity Injection Schedule Inj #1 - 02/23/23 Inj #2 - 03/28/23 Inj #3 - 04/28/23 Inj #4 - scheduled 05/31/23 Inj #5 - scheduled 07/05/23 Inj #6  Inj #7  Inj #8  Inj #9  Inj #10  Inj #11  Inj #12

## 2023-04-27 ENCOUNTER — Encounter: Payer: Self-pay | Admitting: *Deleted

## 2023-04-28 ENCOUNTER — Ambulatory Visit: Payer: 59 | Admitting: Family Medicine

## 2023-04-28 DIAGNOSIS — M810 Age-related osteoporosis without current pathological fracture: Secondary | ICD-10-CM

## 2023-04-28 MED ORDER — ROMOSOZUMAB-AQQG 105 MG/1.17ML ~~LOC~~ SOSY
210.0000 mg | PREFILLED_SYRINGE | Freq: Once | SUBCUTANEOUS | Status: AC
  Administered 2023-04-28: 210 mg via SUBCUTANEOUS

## 2023-04-28 NOTE — Progress Notes (Signed)
Patient is here for evenity injection #3. Patient received bilateral lower abdomen [place] evenity injections today. She tolerated injections well. She will return in 1 month for her next evenity injection.

## 2023-05-20 NOTE — Telephone Encounter (Signed)
 New Evenity VOB initiated with [employer] via [employer].

## 2023-05-21 NOTE — Telephone Encounter (Signed)
 VOB pending

## 2023-05-30 NOTE — Telephone Encounter (Addendum)
 Patient is ready for scheduling on or after: BUY AND BILL  Out-of-pocket cost due at time of visit: $0  Primary: [employer] [place] Evenity  co-insurance: 0% Admin fee co-insurance: 0%  Deductible: does not apply  Prior Auth: Required. Completed form and clinicals faxed to [facility] [place] PA is denied. Will advise MD for peer to peer.    ** This summary of benefits is an estimation of the patient's out-of-pocket cost. Exact cost may vary based on individual plan coverage.

## 2023-05-31 ENCOUNTER — Ambulatory Visit: Payer: BC Managed Care – PPO | Admitting: Family Medicine

## 2023-05-31 DIAGNOSIS — M810 Age-related osteoporosis without current pathological fracture: Secondary | ICD-10-CM

## 2023-05-31 MED ORDER — ROMOSOZUMAB-AQQG 105 MG/1.17ML ~~LOC~~ SOSY
210.0000 mg | PREFILLED_SYRINGE | Freq: Once | SUBCUTANEOUS | Status: AC
  Administered 2023-05-31: 210 mg via SUBCUTANEOUS

## 2023-05-31 NOTE — Progress Notes (Signed)
 Patient is here for evenity injection #4. Patient received bilateral lower abdomen [place] evenity injections today. She tolerated injections well. She will return in 1 month for her next evenity injection.

## 2023-06-02 NOTE — Telephone Encounter (Signed)
Evenity Injection Schedule Inj #1 - 02/23/23 Inj #2 - 03/28/23 Inj #3 - 04/28/23 Inj #4 - 05/31/23 Inj #5 - scheduled 07/05/23 Inj #6 - scheduled 08/03/23 Inj #7  Inj #8  Inj #9  Inj #10  Inj #11  Inj #12    NEEDS NEW PRIOR AUTH -previous prior auth valid for [facility], not BCBS.

## 2023-06-14 NOTE — Telephone Encounter (Signed)
Prior Auth initiated with BCBS via [facility] portal PA# [id number] Status: pending review

## 2023-07-05 ENCOUNTER — Ambulatory Visit (INDEPENDENT_AMBULATORY_CARE_PROVIDER_SITE_OTHER): Payer: 59 | Admitting: Family Medicine

## 2023-07-05 VITALS — Ht 66.0 in

## 2023-07-05 DIAGNOSIS — M818 Other osteoporosis without current pathological fracture: Secondary | ICD-10-CM

## 2023-07-05 MED ORDER — ROMOSOZUMAB-AQQG 105 MG/1.17ML ~~LOC~~ SOSY
210.0000 mg | PREFILLED_SYRINGE | Freq: Once | SUBCUTANEOUS | Status: AC
  Administered 2023-07-05: 210 mg via SUBCUTANEOUS

## 2023-07-05 NOTE — Telephone Encounter (Addendum)
 Evenity VOB re-verified with updated [employer] info.  Patient is ready for scheduling on or after  BUY AND BILL  Out-of-pocket cost due at time of visit: $507.41  OR $25 if using [employer] copay assist card, which patient should know if she was previously enrolled.   Primary: [employer] Evenity co-insurance: 20% (approximately 482.41) Admin fee co-insurance: 20% (approximately $25)  Deductible: $4500 ($4500 met)  Prior Auth: Authorized Auth #: [id number] Valid: 03/10/2023 to 03/09/2024    ** This summary of benefits is an estimation of the patient's out-of-pocket cost. Exact cost may vary based on individual plan coverage.

## 2023-07-05 NOTE — Progress Notes (Signed)
 Patient is here for evenity injection #5. Patient received bilateral lower abdomen [place] evenity injections today. She tolerated injections well. She will return in 1 month for her next evenity injection.

## 2023-07-13 NOTE — Telephone Encounter (Addendum)
 Evenity Injection Schedule Inj #1 - 02/23/23 Inj #2 - 03/28/23 Inj #3 - 04/28/23 Inj #4 - 05/31/23 Inj #5 - 07/05/23 Inj #6 - 08/03/23 Inj #7 - scheduled 09/09/23 Inj #8 - scheduled 10/12/23 Inj #9  Inj #10  Inj #11  Inj #12   Candice Hansen, has this [name] been approved with BCBS?

## 2023-07-13 NOTE — Telephone Encounter (Signed)
 Patient has been enrolled in [employer].

## 2023-08-03 ENCOUNTER — Ambulatory Visit: Payer: Self-pay | Admitting: Family Medicine

## 2023-08-03 VITALS — Ht 66.0 in

## 2023-08-03 DIAGNOSIS — M810 Age-related osteoporosis without current pathological fracture: Secondary | ICD-10-CM | POA: Diagnosis not present

## 2023-08-03 MED ORDER — ROMOSOZUMAB-AQQG 105 MG/1.17ML ~~LOC~~ SOSY
210.0000 mg | PREFILLED_SYRINGE | Freq: Once | SUBCUTANEOUS | Status: AC
  Administered 2023-08-03: 210 mg via SUBCUTANEOUS

## 2023-08-03 NOTE — Progress Notes (Signed)
 Patient is here for evenity injection #6. Patient received bilateral lower abdomen [place] evenity injections today. She tolerated injections well. She will return in 1 month for her next evenity injection.

## 2023-09-09 ENCOUNTER — Ambulatory Visit (INDEPENDENT_AMBULATORY_CARE_PROVIDER_SITE_OTHER): Payer: Self-pay | Admitting: Pain Medicine

## 2023-09-09 VITALS — Ht 66.0 in

## 2023-09-09 DIAGNOSIS — M810 Age-related osteoporosis without current pathological fracture: Secondary | ICD-10-CM

## 2023-09-09 MED ORDER — ROMOSOZUMAB-AQQG 105 MG/1.17ML ~~LOC~~ SOSY
210.0000 mg | PREFILLED_SYRINGE | Freq: Once | SUBCUTANEOUS | Status: AC
  Administered 2023-09-09: 210 mg via SUBCUTANEOUS

## 2023-09-09 NOTE — Progress Notes (Signed)
 Patient is here for evenity injection #7. Patient received bilateral lower abdomen [place] evenity injections today. She tolerated injections well. She will return in 1 month for her next evenity injection.

## 2023-10-12 ENCOUNTER — Ambulatory Visit (INDEPENDENT_AMBULATORY_CARE_PROVIDER_SITE_OTHER): Admitting: Family Medicine

## 2023-10-12 DIAGNOSIS — M818 Other osteoporosis without current pathological fracture: Secondary | ICD-10-CM

## 2023-10-12 DIAGNOSIS — M810 Age-related osteoporosis without current pathological fracture: Secondary | ICD-10-CM

## 2023-10-12 MED ORDER — ROMOSOZUMAB-AQQG 105 MG/1.17ML ~~LOC~~ SOSY
210.0000 mg | PREFILLED_SYRINGE | Freq: Once | SUBCUTANEOUS | Status: AC
  Administered 2023-10-12: 210 mg via SUBCUTANEOUS

## 2023-10-12 NOTE — Progress Notes (Signed)
 Patient is here for evenity injection #8. Patient received bilateral lower abdomen [place] evenity injections today. She tolerated injections well. She will return in 1 month for her next evenity injection.

## 2023-11-23 ENCOUNTER — Ambulatory Visit (INDEPENDENT_AMBULATORY_CARE_PROVIDER_SITE_OTHER): Admitting: Family Medicine

## 2023-11-23 VITALS — Ht 66.0 in

## 2023-11-23 DIAGNOSIS — M818 Other osteoporosis without current pathological fracture: Secondary | ICD-10-CM | POA: Diagnosis not present

## 2023-11-23 MED ORDER — ROMOSOZUMAB-AQQG 105 MG/1.17ML ~~LOC~~ SOSY
210.0000 mg | PREFILLED_SYRINGE | Freq: Once | SUBCUTANEOUS | Status: AC
  Administered 2023-11-23: 210 mg via SUBCUTANEOUS

## 2023-11-23 NOTE — Progress Notes (Signed)
 Patient is here for evenity  injection #9. Patient received bilateral lower abdomen [place] evenity  injections today. She tolerated injections well. She will return in 1 month for her next evenity  injection.

## 2023-11-26 IMAGING — MR MR HEAD WO/W CM
15 series · 48 of 48 positions shown · IV contrast (5ml Gadavist)
Comparison: Head CT 07/25/2010

CLINICAL DATA: Staging of recently diagnosed lung cancer.

EXAM:
MRI HEAD WITHOUT AND WITH CONTRAST
TECHNIQUE: Multiplanar, multiecho pulse sequences of the brain and surrounding
structures were obtained without and with intravenous contrast.
CONTRAST:  5mL GADAVIST GADOBUTROL 1 MMOL/ML IV SOLN

[Series 5: ax dwi_tracew · axial · 3.0mm · 0.65mm/px · z∈[-99,+56]mm · 2 of 48 slices shown]
[im 1/48]
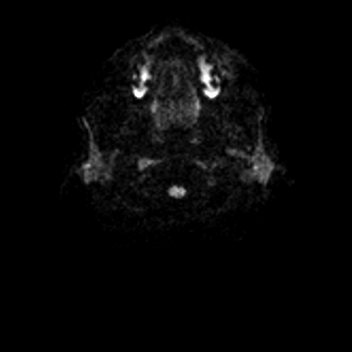
[im 48/48]
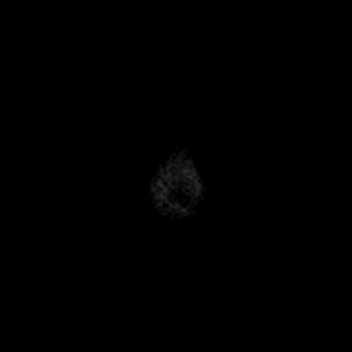

[Series 6: ax dwi_adc · axial · 3.0mm · 0.65mm/px · z∈[-99,+56]mm · 3 of 48 slices shown]
[im 1/48]
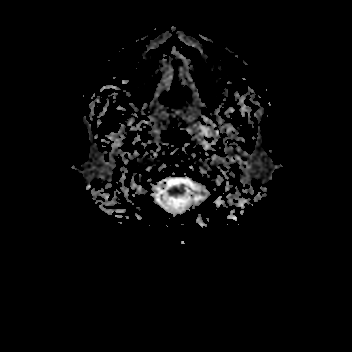
[im 24/48]
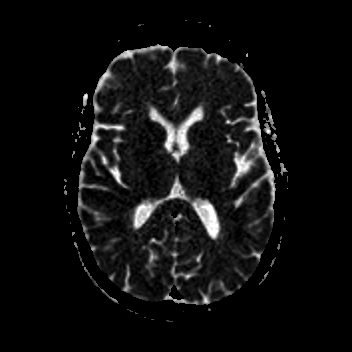
[im 48/48]
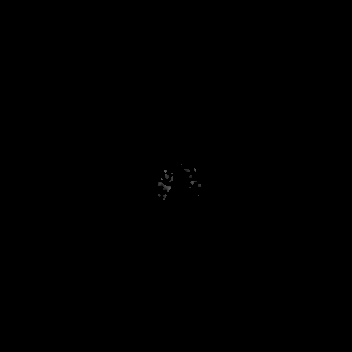

[Series 7: cor dwi_tracew · coronal · 5.0mm · 0.65mm/px · 2 of 40 slices shown]
[im 1/40]
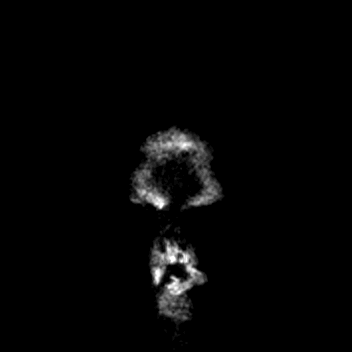
[im 40/40]
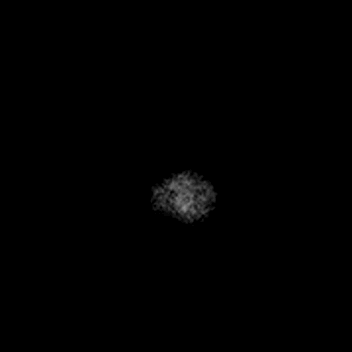

[Series 8: cor dwi_adc · coronal · 5.0mm · 0.65mm/px · 2 of 38 slices shown]
[im 1/38]
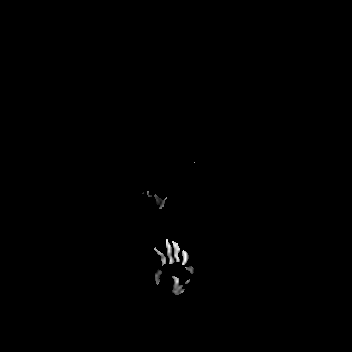
[im 38/38]
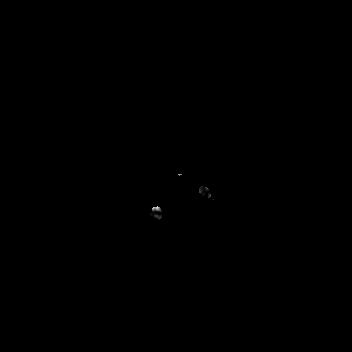

[Series 9: T1 · sagittal · 5.0mm · 0.62mm/px · 1 of 21 slices shown (1 of 2)]
[im 1/21]
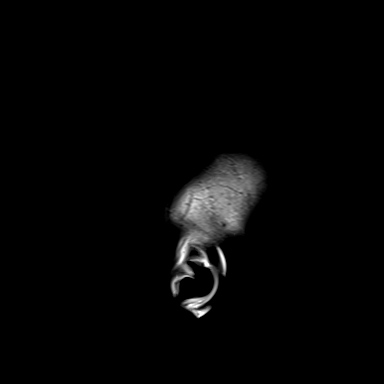

[Series 10: T2 · axial · 5.0mm · 0.53mm/px · 1 of 25 slices shown]
[im 1/25]
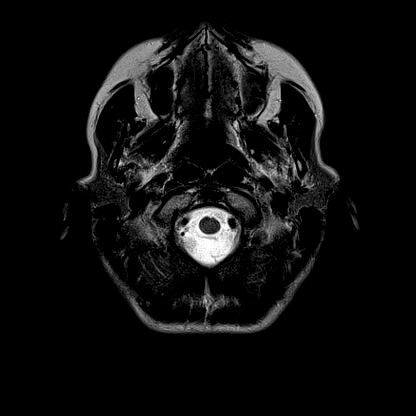

[Series 11: mag_images · axial · 3.0mm · 0.90mm/px · z∈[-110,+67]mm · 3 of 60 slices shown]
[im 1/60]
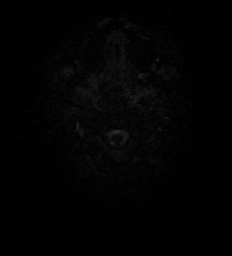
[im 30/60]
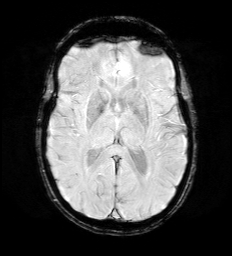
[im 60/60]
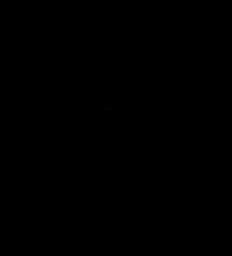

[Series 12: pha_images · axial · 3.0mm · 0.90mm/px · z∈[-110,+64]mm · 3 of 59 slices shown]
[im 1/59]
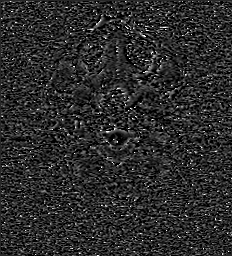
[im 30/59]
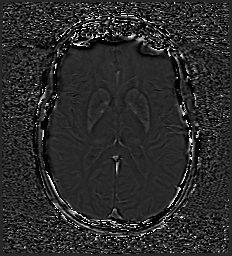
[im 59/59]
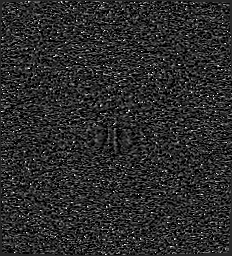

[Series 13: swi_images · axial · 3.0mm · 0.90mm/px · z∈[-110,+67]mm · 3 of 60 slices shown]
[im 1/60]
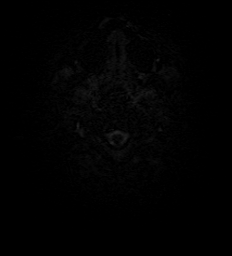
[im 30/60]
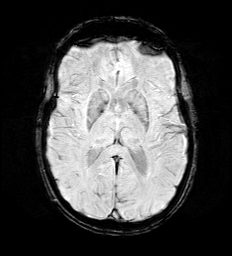
[im 60/60]
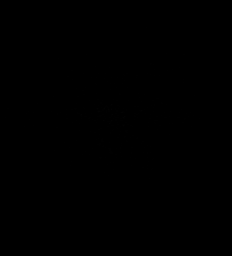

[Series 15: FLAIR · axial · 3.0mm · 0.53mm/px · z∈[-102,+60]mm · 3 of 55 slices shown]
[im 1/55]
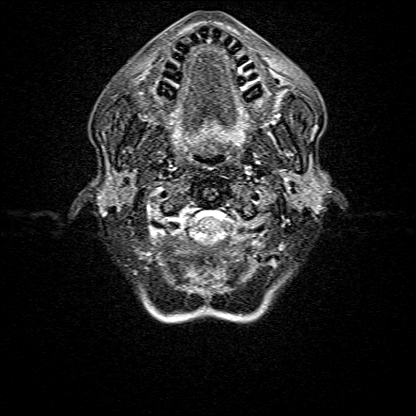
[im 28/55]
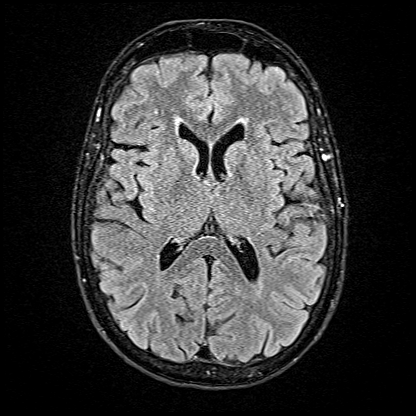
[im 55/55]
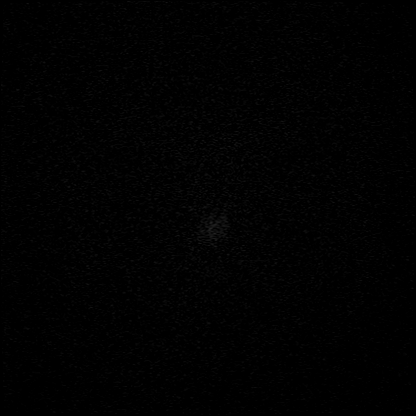

[Series 16: T1 · axial · 1.0mm · 0.98mm/px · z∈[-109,+66]mm · 10 of 176 slices shown (2 of 2)]
[im 1/176]
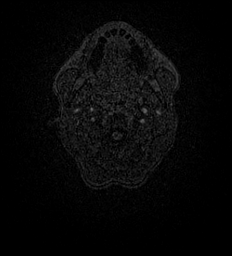
[im 20/176]
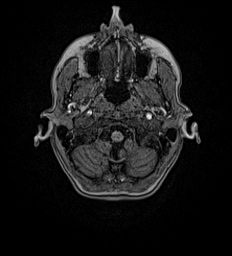
[im 39/176]
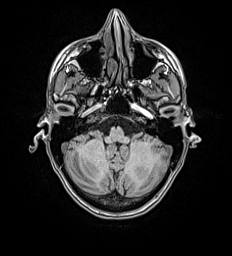
[im 59/176]
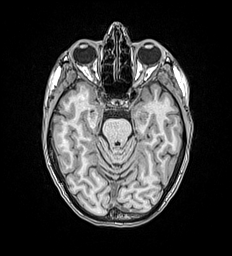
[im 78/176]
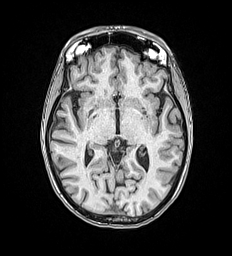
[im 98/176]
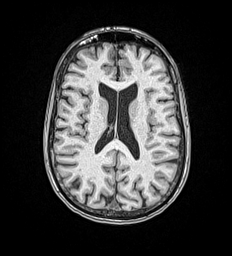
[im 117/176]
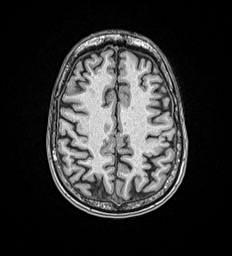
[im 137/176]
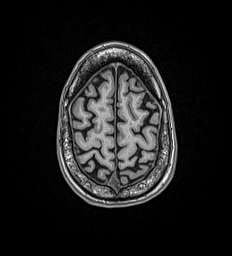
[im 156/176]
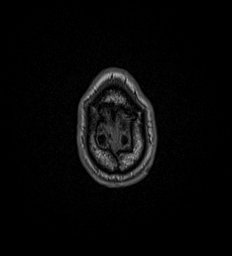
[im 176/176]
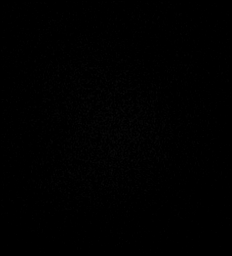

[Series 17: T2 post-contrast · coronal · 5.0mm · 0.57mm/px · 2 of 29 slices shown]
[im 1/29]
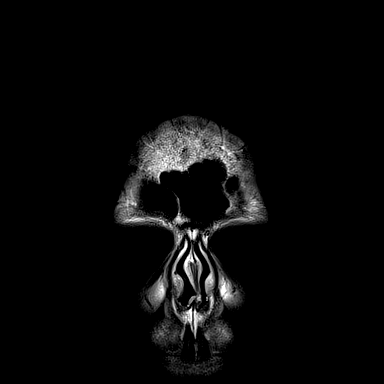
[im 29/29]
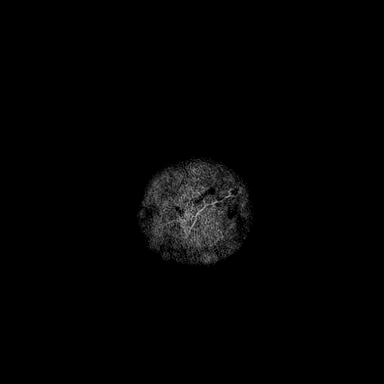

[Series 18: T1 post-contrast · axial · 1.0mm · 0.98mm/px · z∈[-109,+66]mm · 10 of 175 slices shown (1 of 3)]
[im 1/175]
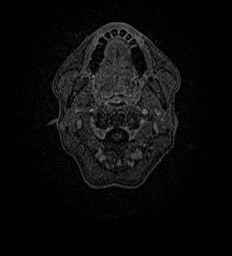
[im 20/175]
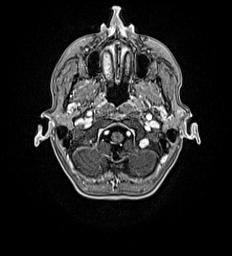
[im 39/175]
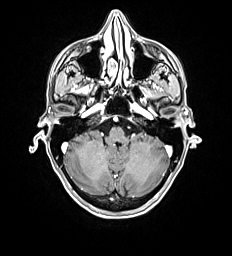
[im 59/175]
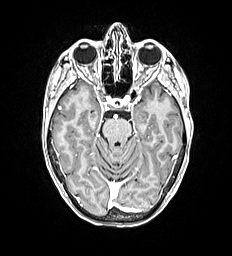
[im 78/175]
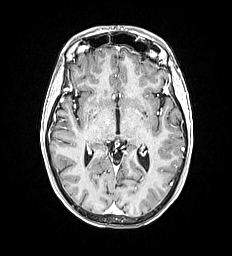
[im 97/175]
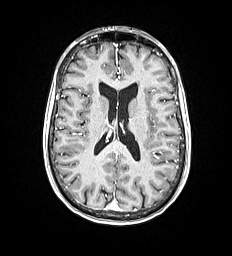
[im 117/175]
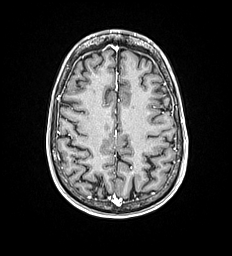
[im 136/175]
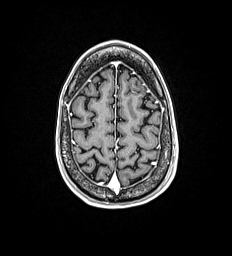
[im 155/175]
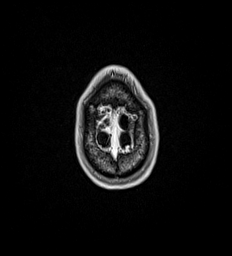
[im 175/175]
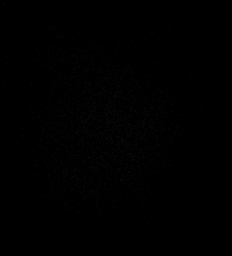

[Series 19: T1 post-contrast · coronal · 5.0mm · 0.57mm/px · 2 of 29 slices shown (2 of 3)]
[im 1/29]
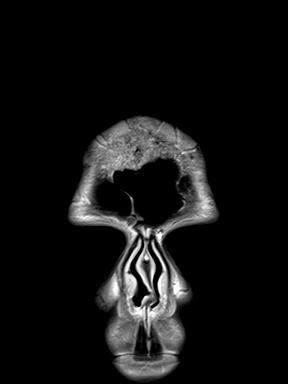
[im 29/29]
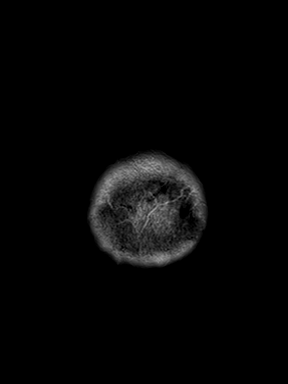

[Series 20: T1 post-contrast · sagittal · 5.0mm · 0.62mm/px · 1 of 21 slices shown (3 of 3)]
[im 1/21]
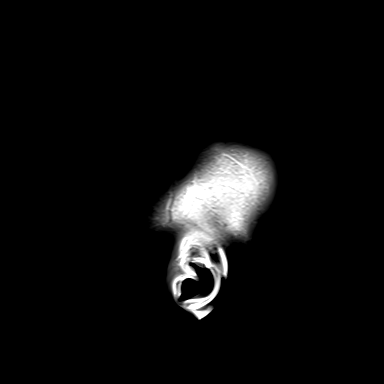

[48 of 48 positions shown; findings below may reference images not displayed]

FINDINGS: Brain: There is no evidence of an acute infarct, intracranial
hemorrhage, mass, midline shift, or extra-axial fluid collection.
Small T2 hyperintensities in the cerebral white matter bilaterally
are nonspecific but compatible with mild chronic small vessel
ischemic disease. The ventricles and sulci are normal. No abnormal
enhancement is identified.

Vascular: Major intracranial vascular flow voids are preserved.

Skull and upper cervical spine: Unremarkable bone marrow signal.

Sinuses/Orbits: Unremarkable orbits. Paranasal sinuses and mastoid
air cells are clear.

Other: None.
IMPRESSION: 1. No evidence of intracranial metastases.
2. Mild chronic small vessel ischemic disease.

## 2024-01-04 ENCOUNTER — Ambulatory Visit (INDEPENDENT_AMBULATORY_CARE_PROVIDER_SITE_OTHER): Admitting: Family Medicine

## 2024-01-04 DIAGNOSIS — M818 Other osteoporosis without current pathological fracture: Secondary | ICD-10-CM | POA: Diagnosis not present

## 2024-01-04 MED ORDER — ROMOSOZUMAB-AQQG 105 MG/1.17ML ~~LOC~~ SOSY
210.0000 mg | PREFILLED_SYRINGE | Freq: Once | SUBCUTANEOUS | Status: AC
  Administered 2024-01-04: 210 mg via SUBCUTANEOUS

## 2024-01-04 NOTE — Progress Notes (Signed)
 Patient is here for evenity  injection #10. Patient received bilateral lower abdomen  evenity  injections today. She tolerated injections well. She will return in 1 month for her next evenity  injection.

## 2024-02-06 ENCOUNTER — Other Ambulatory Visit: Payer: Self-pay | Admitting: Internal Medicine

## 2024-02-06 DIAGNOSIS — Z1231 Encounter for screening mammogram for malignant neoplasm of breast: Secondary | ICD-10-CM

## 2024-02-07 ENCOUNTER — Other Ambulatory Visit: Payer: Self-pay

## 2024-02-07 ENCOUNTER — Ambulatory Visit (INDEPENDENT_AMBULATORY_CARE_PROVIDER_SITE_OTHER): Admitting: Family Medicine

## 2024-02-07 ENCOUNTER — Emergency Department (HOSPITAL_COMMUNITY)
Admission: EM | Admit: 2024-02-07 | Discharge: 2024-02-08 | Disposition: A | Discharge status: Home / Self Care | Attending: Emergency Medicine | Admitting: Emergency Medicine

## 2024-02-07 ENCOUNTER — Encounter (HOSPITAL_COMMUNITY): Payer: Self-pay | Admitting: Radiology

## 2024-02-07 ENCOUNTER — Ambulatory Visit
Admission: RE | Admit: 2024-02-07 | Discharge: 2024-02-07 | Disposition: A | Discharge status: Home / Self Care | Source: Ambulatory Visit | Attending: Internal Medicine | Admitting: Internal Medicine

## 2024-02-07 DIAGNOSIS — M818 Other osteoporosis without current pathological fracture: Secondary | ICD-10-CM | POA: Diagnosis not present

## 2024-02-07 DIAGNOSIS — D649 Anemia, unspecified: Secondary | ICD-10-CM | POA: Insufficient documentation

## 2024-02-07 DIAGNOSIS — Z1231 Encounter for screening mammogram for malignant neoplasm of breast: Secondary | ICD-10-CM

## 2024-02-07 DIAGNOSIS — R55 Syncope and collapse: Secondary | ICD-10-CM | POA: Diagnosis not present

## 2024-02-07 DIAGNOSIS — R42 Dizziness and giddiness: Secondary | ICD-10-CM | POA: Diagnosis present

## 2024-02-07 LAB — CBC
HCT: 31 % — ABNORMAL LOW (ref 36.0–46.0)
Hemoglobin: 10.4 g/dL — ABNORMAL LOW (ref 12.0–15.0)
MCH: 32.6 pg (ref 26.0–34.0)
MCHC: 33.5 g/dL (ref 30.0–36.0)
MCV: 97.2 fL (ref 80.0–100.0)
Platelets: 89 K/uL — ABNORMAL LOW (ref 150–400)
RBC: 3.19 MIL/uL — ABNORMAL LOW (ref 3.87–5.11)
RDW: 13.5 % (ref 11.5–15.5)
WBC: 8.6 K/uL (ref 4.0–10.5)
nRBC: 0 % (ref 0.0–0.2)

## 2024-02-07 LAB — COMPREHENSIVE METABOLIC PANEL WITH GFR
ALT: 43 U/L (ref 0–44)
AST: 33 U/L (ref 15–41)
Albumin: 4.1 g/dL (ref 3.5–5.0)
Alkaline Phosphatase: 72 U/L (ref 38–126)
Anion gap: 13 (ref 5–15)
BUN: 21 mg/dL — ABNORMAL HIGH (ref 6–20)
CO2: 21 mmol/L — ABNORMAL LOW (ref 22–32)
Calcium: 8.6 mg/dL — ABNORMAL LOW (ref 8.9–10.3)
Chloride: 100 mmol/L (ref 98–111)
Creatinine, Ser: 1.12 mg/dL — ABNORMAL HIGH (ref 0.44–1.00)
GFR, Estimated: 57 mL/min — ABNORMAL LOW (ref >60)
Glucose, Bld: 91 mg/dL (ref 70–99)
Potassium: 3.8 mmol/L (ref 3.5–5.1)
Sodium: 134 mmol/L — ABNORMAL LOW (ref 135–145)
Total Bilirubin: 0.4 mg/dL (ref 0.0–1.2)
Total Protein: 6.6 g/dL (ref 6.5–8.1)

## 2024-02-07 LAB — POC OCCULT BLOOD, ED: Fecal Occult Bld: NEGATIVE

## 2024-02-07 MED ORDER — LACTATED RINGERS IV BOLUS
1000.0000 mL | Freq: Once | INTRAVENOUS | Status: AC
  Administered 2024-02-07: 1000 mL via INTRAVENOUS

## 2024-02-07 MED ORDER — ROMOSOZUMAB-AQQG 105 MG/1.17ML ~~LOC~~ SOSY
210.0000 mg | PREFILLED_SYRINGE | Freq: Once | SUBCUTANEOUS | Status: AC
  Administered 2024-02-07: 210 mg via SUBCUTANEOUS

## 2024-02-07 NOTE — Progress Notes (Signed)
 Patient is here for evenity  injection #11. Patient received bilateral lower abdomen [place] evenity  injections today. She tolerated injections well. She will return in 1 month for her next evenity  injection.

## 2024-02-07 NOTE — ED Triage Notes (Signed)
 Pt states that around 6pm tonight she had a sudden onset of dizziness while sitting. She states the only relief she gets is when she is laying supine. EMS got a BP laying down of 110/80 and when she sat up it dropped to 90/60. Of note she received her 11th injection of Evenity  today in bil lower abdomen. She has had chills and has a temp of 99.0. She hyas a historty of lung cancer and had her LLL removed.   18G L ac 94/60 Cbg 71 80

## 2024-02-07 NOTE — ED Provider Notes (Signed)
 [place] EMERGENCY DEPARTMENT AT [facility] Provider Note   CSN: [id number] Arrival date & time: 02/07/24  2109     Patient presents with: Dizziness   Candice Hansen is a 58 y.o. female.    Dizziness    Pt states she had an episode of dizziness, head spinning that occurred at around 6pm.  Pt was at church feeding people.   No trouble with vision or speech.  The sx increase with movement.  Lying to standing.  She felt like she might pass out.  Turning her head does not make it worse.  Np cp or sob, no vomiting or diarrhea.  Prior to Admission medications   Medication Sig Start Date End Date Taking? Authorizing Provider  acetaminophen (TYLENOL) 500 MG tablet Take 1,000 mg by mouth every 6 (six) hours as needed (for pain.).    [provider]  calcium carbonate (TUMS - DOSED IN MG ELEMENTAL CALCIUM) 500 MG chewable tablet Chew 1-2 tablets by mouth 2 (two) times daily as needed for indigestion or heartburn.    [provider]  ibuprofen (ADVIL) 200 MG tablet Take 400 mg by mouth every 8 (eight) hours as needed (pain.).    [provider]  loratadine (CLARITIN) 10 MG tablet     [provider]  rosuvastatin (CRESTOR) 10 MG tablet Take 10 mg by mouth in the morning. 02/12/21   [provider]    Allergies: Penicillins    Review of Systems  Neurological:  Positive for dizziness.    Updated Vital Signs BP 103/69   Pulse 79   Temp 98.6 F (37 C) (Oral)   Resp (!) 21   Ht 1.676 m (5' 6)   Wt 47.2 kg   LMP 05/17/2018   SpO2 95%   BMI 16.79 kg/m   Physical Exam Vitals and nursing note reviewed.  Constitutional:      General: She is not in acute distress.    Appearance: She is well-developed.  HENT:     Head: Normocephalic and atraumatic.     Right Ear: External ear normal.     Left Ear: External ear normal.  Eyes:     General: No scleral icterus.       Right eye: No discharge.        Left eye: No discharge.      Conjunctiva/sclera: Conjunctivae normal.  Neck:     Trachea: No tracheal deviation.  Cardiovascular:     Rate and Rhythm: Normal rate and regular rhythm.  Pulmonary:     Effort: Pulmonary effort is normal. No respiratory distress.     Breath sounds: Normal breath sounds. No stridor. No wheezing or rales.  Abdominal:     General: Bowel sounds are normal. There is no distension.     Palpations: Abdomen is soft.     Tenderness: There is no abdominal tenderness. There is no guarding or rebound.  Musculoskeletal:        General: No tenderness or deformity.     Cervical back: Neck supple.  Skin:    General: Skin is warm and dry.     Findings: No rash.  Neurological:     General: No focal deficit present.     Mental Status: She is alert.     Cranial Nerves: No cranial nerve deficit, dysarthria or facial asymmetry.     Sensory: No sensory deficit.     Motor: No abnormal muscle tone or seizure activity.     Coordination:  Coordination normal.  Psychiatric:        Mood and Affect: Mood normal.     (all labs ordered are listed, but only abnormal results are displayed) Labs Reviewed  COMPREHENSIVE METABOLIC PANEL WITH GFR - Abnormal; Notable for the following components:      Result Value   Sodium 134 (*)    CO2 21 (*)    BUN 21 (*)    Creatinine, Ser 1.12 (*)    Calcium 8.6 (*)    GFR, Estimated 57 (*)    All other components within normal limits  CBC - Abnormal; Notable for the following components:   RBC 3.19 (*)    Hemoglobin 10.4 (*)    HCT 31.0 (*)    Platelets 89 (*)    All other components within normal limits  POC OCCULT BLOOD, ED  CBG MONITORING, ED    EKG: EKG Interpretation Date/Time:  Tuesday February 07 2024 21:22:00 EDT Ventricular Rate:  83 PR Interval:  125 QRS Duration:  93 QT Interval:  433 QTC Calculation: 509 R Axis:   60  Text Interpretation: Sinus rhythm Probable left atrial enlargement Low voltage, extremity leads Borderline prolonged QT  interval Since last tracing rate faster Confirmed by Randol Simmonds [phone]) on 02/07/2024 10:00:25 PM  Radiology: No results found.   Procedures   Medications Ordered in the ED  lactated ringers  bolus 1,000 mL (0 mLs Intravenous Stopped 02/07/24 2302)    Clinical Course as of 02/07/24 2349  Tue Feb 07, 2024  2307 CBC(!) Hemoglobin decreased compared to previous.  Metabolic panel normal [JK]  2339 POC occult blood, ED Provider will collect Hemoccult leg [JK]    Clinical Course User Index [JK] Randol Simmonds, MD                                 Medical Decision Making Problems Addressed: Anemia, unspecified type: undiagnosed new problem with uncertain prognosis Syncope, unspecified syncope type: acute illness or injury that poses a threat to life or bodily functions  Amount and/or Complexity of Data Reviewed Labs: ordered. Decision-making details documented in ED Course.   Patient presented to the ED for evaluation after a syncopal/near syncopal episode.  Patient states she had an episode of feeling lightheaded and dizzy.  It worsened when standing.  Patient did receive an injection of Evenity  today.  Patient's ED workup is reassuring.  No signs of acute infection.  Patient noted to be anemic but no signs of GI bleeding.  Hemoccult is negative.  Metabolic panel shows creatinine is increased but this is similar to her baseline  Patient was treated with IV fluids.  She was monitored in the ED.  No recurrent episodes.  Patient's blood pressure was low normal but patient states this is normal for her.  Possible she may have had a vasovagal episode.  She may have also been mildly dehydrated  Evaluation and diagnostic testing in the emergency department does not suggest an emergent condition requiring admission or immediate intervention beyond what has been performed at this time.  The patient is safe for discharge and has been instructed to return immediately for worsening symptoms, change  in symptoms or any other concerns.      Final diagnoses:  Syncope, unspecified syncope type  Anemia, unspecified type    ED Discharge Orders     None          Randol Simmonds, MD 02/07/24 2352

## 2024-02-07 NOTE — Discharge Instructions (Addendum)
 Follow-up with your doctor regarding your anemia.  Return to the emergency room if you notice blood in your stool, experience abdominal pain chest pain fevers or have any recurrent episodes

## 2024-03-09 ENCOUNTER — Ambulatory Visit: Admitting: Family Medicine

## 2024-03-09 DIAGNOSIS — M810 Age-related osteoporosis without current pathological fracture: Secondary | ICD-10-CM | POA: Diagnosis not present

## 2024-03-09 MED ORDER — ROMOSOZUMAB-AQQG 105 MG/1.17ML ~~LOC~~ SOSY
210.0000 mg | PREFILLED_SYRINGE | Freq: Once | SUBCUTANEOUS | Status: AC
  Administered 2024-03-09: 210 mg via SUBCUTANEOUS

## 2024-03-09 NOTE — Progress Notes (Signed)
 Patient is here for evenity  injection #12. Patient received bilateral lower abdomen [place] evenity  injections today. She tolerated injections well. She will return in 1 month for her next evenity  injection. Pt has bone density sched for 04/16/24 and will see [place] for results after the scan.

## 2024-03-13 ENCOUNTER — Ambulatory Visit: Admitting: Family Medicine

## 2024-04-09 ENCOUNTER — Ambulatory Visit: Admitting: Family Medicine

## 2024-04-23 ENCOUNTER — Ambulatory Visit: Admitting: Family Medicine

## 2024-04-25 ENCOUNTER — Ambulatory Visit: Admitting: Family Medicine

## 2024-04-26 ENCOUNTER — Ambulatory Visit: Admitting: Family Medicine

## 2024-04-30 ENCOUNTER — Ambulatory Visit: Admitting: Family Medicine

## 2024-05-02 ENCOUNTER — Ambulatory Visit: Admitting: Family Medicine

## 2024-05-02 VITALS — BP 92/68 | Ht 65.75 in | Wt 105.0 lb

## 2024-05-02 DIAGNOSIS — M810 Age-related osteoporosis without current pathological fracture: Secondary | ICD-10-CM

## 2024-05-02 MED ORDER — ALENDRONATE SODIUM 70 MG PO TABS: 70.0000 mg | ORAL_TABLET | ORAL | 3 refills | Status: AC

## 2024-05-02 NOTE — Patient Instructions (Signed)
 Your bone density looks a lot better! Start fosamax  once weekly - take on empty stomach with a full glass of water.  Sit/stand upright for 45 minutes after taking (just don't lie down). Calcium/Vitamin D  tablets - take 1 twice a day. We will check your vitamin D  levels too.

## 2024-05-02 NOTE — Progress Notes (Signed)
 PCP: Clarice Nottingham, MD  Patient is a 58 y.o. female here for osteoporosis follow up.  Osteoporosis f/u Prior treatment: Evenity  02/23/23-03/09/24 x12 injections History of Hip, Spine, or Wrist Fracture: No Heart disease or stroke: No Cancer: Yes; lung cancer in remission (taking Tagrisso 80 mg through May 2025) Kidney Disease: No Gastric/Peptic Ulcer: No Gastric bypass surgery: No Severe GERD: No History of seizures: No Age at Menopause: 51 Hysterectomy: No Calcium intake: Tums [phone] mg and vit D 2000 IU, now switching to single pill calcium w/ vit D Vitamin D  intake: 50 mcg Hormone replacement therapy: No Smoking history: Never Alcohol: 0 Exercise: Daily walking, pushups 2x/week, stretching Major dental work in past year: No Parents with hip/spine fracture: No  Patient reports no adverse effects with Evenity . She has a education officer, community, whom she sees regularly and has no major dental issues.   Past Medical History:  Diagnosis Date   Chest pain 2019   Heart murmur    nothing to worry about   Palpitation    Syncope 2015   cause undetermined    Medications Ordered Prior to Encounter[1]  Past Surgical History:  Procedure Laterality Date   BRONCHIAL BIOPSY  03/23/2021   Procedure: BRONCHIAL BIOPSIES;  Surgeon: Shelah Lamar RAMAN, MD;  Location: [facility] ENDOSCOPY;  Service: Pulmonary;;   BRONCHIAL BRUSHINGS  03/23/2021   Procedure: BRONCHIAL BRUSHINGS;  Surgeon: Shelah Lamar RAMAN, MD;  Location: [facility] ENDOSCOPY;  Service: Pulmonary;;   BRONCHIAL NEEDLE ASPIRATION BIOPSY  03/23/2021   Procedure: BRONCHIAL NEEDLE ASPIRATION BIOPSIES;  Surgeon: Shelah Lamar RAMAN, MD;  Location: [facility] ENDOSCOPY;  Service: Pulmonary;;   BRONCHIAL WASHINGS  03/23/2021   Procedure: BRONCHIAL WASHINGS;  Surgeon: Shelah Lamar RAMAN, MD;  Location: [facility] ENDOSCOPY;  Service: Pulmonary;;   COLON RESECTION  2019   for twisted secum   FINE NEEDLE ASPIRATION  03/10/2021   Procedure: FINE NEEDLE ASPIRATION (FNA) LINEAR;  Surgeon:  Brenna Adine CROME, DO;  Location: [facility] ENDOSCOPY;  Service: Pulmonary;;   OVARIAN CYST REMOVAL     VIDEO BRONCHOSCOPY WITH ENDOBRONCHIAL NAVIGATION N/A 03/23/2021   Procedure: ROBOTIC ASSISTED VIDEO BRONCHOSCOPY WITH ENDOBRONCHIAL NAVIGATION;  Surgeon: Shelah Lamar RAMAN, MD;  Location: [facility] ENDOSCOPY;  Service: Pulmonary;  Laterality: N/A;   VIDEO BRONCHOSCOPY WITH ENDOBRONCHIAL ULTRASOUND Bilateral 03/10/2021   Procedure: VIDEO BRONCHOSCOPY WITH ENDOBRONCHIAL ULTRASOUND;  Surgeon: Brenna Adine CROME, DO;  Location: [facility] ENDOSCOPY;  Service: Pulmonary;  Laterality: Bilateral;   VIDEO BRONCHOSCOPY WITH ENDOBRONCHIAL ULTRASOUND N/A 03/23/2021   Procedure: VIDEO BRONCHOSCOPY WITH ENDOBRONCHIAL ULTRASOUND;  Surgeon: Shelah Lamar RAMAN, MD;  Location: [facility] ENDOSCOPY;  Service: Pulmonary;  Laterality: N/A;    Allergies[2]  BP 92/68   Ht 5' 5.75 (1.67 m)   Wt 105 lb (47.6 kg)   LMP 05/17/2018   BMI 17.08 kg/m       No data to display              No data to display              Objective:  Physical Exam:  General: Age-appropriate, resting comfortably in chair, NAD, alert and at baseline. Pulmonary: No increased WOB, no accessory muscle usage on room air.  DEXA 01/04/23 T-scores: Spine -3.5 Hips R -2.3 and L -2.1  Repeat DEXA 04/18/24 T-scores: Spine -2.3 Hips R -1.6 and L -1.4  Most recent 10/7 labs: Ca 9.8, Mag 2.1 Cr 1.03 Hgb 12.2, Plts 103   Assessment and Plan:   Hx Osteoporosis Patient has had significant improvement with Evenity   and is now with osteopenia, worst T score of -2.3 at lumbar spine.  Given her current maintenance therapy s/p lung cancer, after discussion will trial bisphosphonate for now and consider switching to Prolia  after this therapy course is complete in May. - Check 25-OH vit D - 1200 mg Ca and 800 IU vit D daily - Risks and benefits of bisphosphonate vs Prolia  vs no therapy discussed - Trial Fosamax  weekly through May, then reevaluate for side effects and  desire to switch to Prolia   Cici Rodriges Toma, MD PGY-2, [facility] 05/02/2024, 10:01 AM     [1]  Current Outpatient Medications on File Prior to Visit  Medication Sig Dispense Refill   acetaminophen (TYLENOL) 500 MG tablet Take 1,000 mg by mouth every 6 (six) hours as needed (for pain.).     calcium carbonate (TUMS - DOSED IN MG ELEMENTAL CALCIUM) 500 MG chewable tablet Chew 1-2 tablets by mouth 2 (two) times daily as needed for indigestion or heartburn.     ibuprofen (ADVIL) 200 MG tablet Take 400 mg by mouth every 8 (eight) hours as needed (pain.).     loratadine (CLARITIN) 10 MG tablet      rosuvastatin (CRESTOR) 10 MG tablet Take 10 mg by mouth in the morning.     No current facility-administered medications on file prior to visit.  [2]  Allergies Allergen Reactions   Penicillins Rash    Did it involve swelling of the face/tongue/throat, SOB, or low BP? N Did it involve sudden or severe rash/hives, skin peeling, or any reaction on the inside of your mouth or nose? Y Did you need to seek medical attention at a hospital or doctor's office? N When did it last happen?    20 years ago   If all above answers are NO, may proceed with cephalosporin use.

## 2024-05-03 ENCOUNTER — Other Ambulatory Visit: Payer: Self-pay

## 2024-05-03 ENCOUNTER — Other Ambulatory Visit: Payer: Self-pay | Admitting: *Deleted

## 2024-05-03 DIAGNOSIS — M810 Age-related osteoporosis without current pathological fracture: Secondary | ICD-10-CM

## 2024-05-03 LAB — VITAMIN D 25 HYDROXY (VIT D DEFICIENCY, FRACTURES): Vit D, 25-Hydroxy: 60.7 ng/mL (ref 30.0–100.0)

## 2024-05-03 MED ORDER — DENOSUMAB 60 MG/ML ~~LOC~~ SOSY
60.0000 mg | PREFILLED_SYRINGE | Freq: Once | SUBCUTANEOUS | 0 refills | Status: DC
  Filled 2024-05-03: qty 1, 1d supply, fill #0

## 2024-05-03 MED ORDER — DENOSUMAB 60 MG/ML ~~LOC~~ SOSY: 60.0000 mg | PREFILLED_SYRINGE | Freq: Once | SUBCUTANEOUS | 0 refills | Status: AC

## 2024-05-04 ENCOUNTER — Telehealth: Payer: Self-pay | Admitting: *Deleted

## 2024-05-04 DIAGNOSIS — M810 Age-related osteoporosis without current pathological fracture: Secondary | ICD-10-CM

## 2024-05-04 NOTE — Telephone Encounter (Addendum)
 Specialty pharmacy fill:   Patient is ready for scheduling after prescription is filled and picked up.   Out-of-pocket cost due at time of visit: see above  Primary: [employer] Medicare   For the primary MD Purchase option, Prolia  and administration are covered at 100% after a $4500.00 deductible ($4500.00 met).  Deductible does contribute to a $5000.00 out of pocket max ($5000.00 met).  Referral is not required. We have provided in network benefits only.   For the primary Specialty Pharmacy option, Prolia  and administration are covered at 100% after a $4500.00 deductible ($4500.00 met). Deductible does contribute to a $5000.00 out of pocket max ($5000.00 met).   Prior Auth: Approved for pharmacy coverage Valid: 05/14/24 - 05/14/25 PHARMACY DETAILS: The specialty pharmacy for this plan is [facility] at [phone] ; fax number is undisclosed.  Completed PA form and clinicals faxed to [facility] at [phone].    ** This summary of benefits is an estimation of the patient's out-of-pocket cost. Exact cost may vary based on individual plan coverage.

## 2024-05-14 MED ORDER — DENOSUMAB 60 MG/ML ~~LOC~~ SOSY: 60.0000 mg | PREFILLED_SYRINGE | Freq: Once | SUBCUTANEOUS | 0 refills | Status: AC

## 2024-05-21 ENCOUNTER — Ambulatory Visit: Admitting: Family Medicine

## 2024-05-24 ENCOUNTER — Other Ambulatory Visit: Payer: Self-pay | Admitting: *Deleted

## 2024-05-24 ENCOUNTER — Ambulatory Visit: Admitting: Family Medicine

## 2024-05-24 VITALS — Ht 66.0 in

## 2024-05-24 DIAGNOSIS — M810 Age-related osteoporosis without current pathological fracture: Secondary | ICD-10-CM | POA: Diagnosis not present

## 2024-05-24 MED ORDER — DENOSUMAB 60 MG/ML ~~LOC~~ SOSY
60.0000 mg | PREFILLED_SYRINGE | Freq: Once | SUBCUTANEOUS | Status: AC
  Administered 2024-05-24: 60 mg via SUBCUTANEOUS

## 2024-05-24 MED ORDER — DENOSUMAB 60 MG/ML ~~LOC~~ SOSY: 60.0000 mg | PREFILLED_SYRINGE | Freq: Once | SUBCUTANEOUS | Status: DC

## 2024-05-24 NOTE — Progress Notes (Signed)
 Patient given prolia  [place] injection 60mg /ml in the left lower abdomen. Patient tolerated injection well without reaction at the injection site. Patient will schedule next injection, which is 6 months from today. [facility].

## 2024-05-25 ENCOUNTER — Ambulatory Visit: Admitting: Family Medicine

## 2024-11-29 ENCOUNTER — Ambulatory Visit: Admitting: Family Medicine
